# Patient Record
Sex: Male | Born: 1945 | Race: Black or African American | Hispanic: No | Marital: Married | Smoking: Never smoker
Health system: Southern US, Community
[De-identification: ages and names within clinical notes are randomized; demographics above are authoritative.]

## PROBLEM LIST (undated history)

## (undated) DIAGNOSIS — F4310 Post-traumatic stress disorder, unspecified: Secondary | ICD-10-CM

## (undated) DIAGNOSIS — H269 Unspecified cataract: Secondary | ICD-10-CM

## (undated) DIAGNOSIS — I10 Essential (primary) hypertension: Secondary | ICD-10-CM

## (undated) HISTORY — DX: Unspecified cataract: H26.9

## (undated) HISTORY — DX: Post-traumatic stress disorder, unspecified: F43.10

## (undated) HISTORY — DX: Essential (primary) hypertension: I10

---

## 2014-07-14 DIAGNOSIS — R7309 Other abnormal glucose: Secondary | ICD-10-CM | POA: Diagnosis not present

## 2014-09-23 DIAGNOSIS — Z79899 Other long term (current) drug therapy: Secondary | ICD-10-CM | POA: Diagnosis not present

## 2014-09-23 DIAGNOSIS — Z0000 Encounter for general adult medical examination without abnormal findings: Secondary | ICD-10-CM | POA: Diagnosis not present

## 2015-06-11 DIAGNOSIS — I10 Essential (primary) hypertension: Secondary | ICD-10-CM | POA: Diagnosis not present

## 2015-06-11 DIAGNOSIS — E559 Vitamin D deficiency, unspecified: Secondary | ICD-10-CM | POA: Diagnosis not present

## 2015-06-15 DIAGNOSIS — Z125 Encounter for screening for malignant neoplasm of prostate: Secondary | ICD-10-CM | POA: Diagnosis not present

## 2015-06-15 DIAGNOSIS — Z1212 Encounter for screening for malignant neoplasm of rectum: Secondary | ICD-10-CM | POA: Diagnosis not present

## 2015-06-15 DIAGNOSIS — Z0000 Encounter for general adult medical examination without abnormal findings: Secondary | ICD-10-CM | POA: Diagnosis not present

## 2015-06-15 DIAGNOSIS — I10 Essential (primary) hypertension: Secondary | ICD-10-CM | POA: Diagnosis not present

## 2015-06-15 DIAGNOSIS — R7309 Other abnormal glucose: Secondary | ICD-10-CM | POA: Diagnosis not present

## 2015-06-15 DIAGNOSIS — E559 Vitamin D deficiency, unspecified: Secondary | ICD-10-CM | POA: Diagnosis not present

## 2015-06-25 DIAGNOSIS — N281 Cyst of kidney, acquired: Secondary | ICD-10-CM | POA: Diagnosis not present

## 2015-12-22 DIAGNOSIS — Z79899 Other long term (current) drug therapy: Secondary | ICD-10-CM | POA: Diagnosis not present

## 2015-12-22 DIAGNOSIS — I10 Essential (primary) hypertension: Secondary | ICD-10-CM | POA: Diagnosis not present

## 2015-12-22 DIAGNOSIS — J309 Allergic rhinitis, unspecified: Secondary | ICD-10-CM | POA: Diagnosis not present

## 2016-07-19 DIAGNOSIS — E559 Vitamin D deficiency, unspecified: Secondary | ICD-10-CM | POA: Diagnosis not present

## 2016-07-19 DIAGNOSIS — R7309 Other abnormal glucose: Secondary | ICD-10-CM | POA: Diagnosis not present

## 2016-07-19 DIAGNOSIS — Z0000 Encounter for general adult medical examination without abnormal findings: Secondary | ICD-10-CM | POA: Diagnosis not present

## 2016-07-19 DIAGNOSIS — I10 Essential (primary) hypertension: Secondary | ICD-10-CM | POA: Diagnosis not present

## 2017-06-05 HISTORY — PX: CATARACT EXTRACTION, BILATERAL: SHX1313

## 2017-07-20 DIAGNOSIS — Z1211 Encounter for screening for malignant neoplasm of colon: Secondary | ICD-10-CM | POA: Diagnosis not present

## 2017-07-20 DIAGNOSIS — I10 Essential (primary) hypertension: Secondary | ICD-10-CM | POA: Diagnosis not present

## 2017-08-21 DIAGNOSIS — I10 Essential (primary) hypertension: Secondary | ICD-10-CM | POA: Diagnosis not present

## 2017-08-21 DIAGNOSIS — Z79899 Other long term (current) drug therapy: Secondary | ICD-10-CM | POA: Diagnosis not present

## 2017-08-21 DIAGNOSIS — R7309 Other abnormal glucose: Secondary | ICD-10-CM | POA: Diagnosis not present

## 2017-08-21 DIAGNOSIS — E559 Vitamin D deficiency, unspecified: Secondary | ICD-10-CM | POA: Diagnosis not present

## 2017-11-02 DIAGNOSIS — D369 Benign neoplasm, unspecified site: Secondary | ICD-10-CM | POA: Diagnosis not present

## 2017-11-02 DIAGNOSIS — K5140 Inflammatory polyps of colon without complications: Secondary | ICD-10-CM | POA: Diagnosis not present

## 2017-11-02 DIAGNOSIS — Z1211 Encounter for screening for malignant neoplasm of colon: Secondary | ICD-10-CM | POA: Diagnosis not present

## 2017-11-02 DIAGNOSIS — D123 Benign neoplasm of transverse colon: Secondary | ICD-10-CM | POA: Diagnosis not present

## 2017-11-02 DIAGNOSIS — K5730 Diverticulosis of large intestine without perforation or abscess without bleeding: Secondary | ICD-10-CM | POA: Diagnosis not present

## 2017-11-02 DIAGNOSIS — D122 Benign neoplasm of ascending colon: Secondary | ICD-10-CM | POA: Diagnosis not present

## 2017-11-02 DIAGNOSIS — Z86010 Personal history of colonic polyps: Secondary | ICD-10-CM | POA: Diagnosis not present

## 2017-11-07 DIAGNOSIS — I10 Essential (primary) hypertension: Secondary | ICD-10-CM | POA: Diagnosis not present

## 2017-11-07 DIAGNOSIS — Z86010 Personal history of colonic polyps: Secondary | ICD-10-CM | POA: Diagnosis not present

## 2018-05-09 ENCOUNTER — Ambulatory Visit: Payer: Self-pay | Admitting: Internal Medicine

## 2018-08-15 ENCOUNTER — Encounter: Payer: Self-pay | Admitting: Internal Medicine

## 2018-08-15 ENCOUNTER — Ambulatory Visit (INDEPENDENT_AMBULATORY_CARE_PROVIDER_SITE_OTHER): Payer: Medicare Other

## 2018-08-15 ENCOUNTER — Ambulatory Visit: Payer: Self-pay | Admitting: Internal Medicine

## 2018-08-15 ENCOUNTER — Ambulatory Visit (INDEPENDENT_AMBULATORY_CARE_PROVIDER_SITE_OTHER): Payer: Medicare Other | Admitting: Internal Medicine

## 2018-08-15 ENCOUNTER — Other Ambulatory Visit: Payer: Self-pay

## 2018-08-15 VITALS — BP 130/80 | HR 86 | Temp 98.7°F | Ht 68.8 in | Wt 245.8 lb

## 2018-08-15 DIAGNOSIS — I10 Essential (primary) hypertension: Secondary | ICD-10-CM | POA: Diagnosis not present

## 2018-08-15 DIAGNOSIS — F4312 Post-traumatic stress disorder, chronic: Secondary | ICD-10-CM

## 2018-08-15 DIAGNOSIS — Z0000 Encounter for general adult medical examination without abnormal findings: Secondary | ICD-10-CM

## 2018-08-15 DIAGNOSIS — Z6836 Body mass index (BMI) 36.0-36.9, adult: Secondary | ICD-10-CM | POA: Diagnosis not present

## 2018-08-15 LAB — POCT URINALYSIS DIPSTICK
Bilirubin, UA: NEGATIVE
Glucose, UA: NEGATIVE
Ketones, UA: NEGATIVE
Nitrite, UA: NEGATIVE
PROTEIN UA: NEGATIVE
Spec Grav, UA: 1.025 (ref 1.010–1.025)
Urobilinogen, UA: 0.2 E.U./dL
pH, UA: 5 (ref 5.0–8.0)

## 2018-08-15 NOTE — Patient Instructions (Signed)
Christopher Harvey , Thank you for taking time to come for your Medicare Wellness Visit. I appreciate your ongoing commitment to your health goals. Please review the following plan we discussed and let me know if I can assist you in the future.   Screening recommendations/referrals: Colonoscopy: 10/2017 Recommended yearly ophthalmology/optometry visit for glaucoma screening and checkup Recommended yearly dental visit for hygiene and checkup  Vaccinations: Influenza vaccine: 02/2018 [facility] Tdap vaccine: 05/2013 Shingles vaccine: states had both    Advanced directives: .Advance directive discussed with you today. Even though you declined this today please call our office should you change your mind and we can give you the proper paperwork for you to fill out.   Conditions/risks identified: obese  Next appointment: 02/20/2019 at 245  Preventive Care 72 Years and Older, Male Preventive care refers to lifestyle choices and visits with your health care provider that can promote health and wellness. What does preventive care include?  A yearly physical exam. This is also called an annual well check.  Dental exams once or twice a year.  Routine eye exams. Ask your health care provider how often you should have your eyes checked.  Personal lifestyle choices, including:  Daily care of your teeth and gums.  Regular physical activity.  Eating a healthy diet.  Avoiding tobacco and drug use.  Limiting alcohol use.  Practicing safe sex.  Taking low doses of aspirin every day.  Taking vitamin and mineral supplements as recommended by your health care provider. What happens during an annual well check? The services and screenings done by your health care provider during your annual well check will depend on your age, overall health, lifestyle risk factors, and family history of disease. Counseling  Your health care provider may ask you questions about your:  Alcohol  use.  Tobacco use.  Drug use.  Emotional well-being.  Home and relationship well-being.  Sexual activity.  Eating habits.  History of falls.  Memory and ability to understand (cognition).  Work and work Statistician. Screening  You may have the following tests or measurements:  Height, weight, and BMI.  Blood pressure.  Lipid and cholesterol levels. These may be checked every 5 years, or more frequently if you are over 23 years old.  Skin check.  Lung cancer screening. You may have this screening every year starting at age 42 if you have a 30-pack-year history of smoking and currently smoke or have quit within the past 15 years.  Fecal occult blood test (FOBT) of the stool. You may have this test every year starting at age 25.  Flexible sigmoidoscopy or colonoscopy. You may have a sigmoidoscopy every 5 years or a colonoscopy every 10 years starting at age 33.  Prostate cancer screening. Recommendations will vary depending on your family history and other risks.  Hepatitis C blood test.  Hepatitis B blood test.  Sexually transmitted disease (STD) testing.  Diabetes screening. This is done by checking your blood sugar (glucose) after you have not eaten for a while (fasting). You may have this done every 1-3 years.  Abdominal aortic aneurysm (AAA) screening. You may need this if you are a current or former smoker.  Osteoporosis. You may be screened starting at age 63 if you are at high risk. Talk with your health care provider about your test results, treatment options, and if necessary, the need for more tests. Vaccines  Your health care provider may recommend certain vaccines, such as:  Influenza vaccine. This  is recommended every year.  Tetanus, diphtheria, and acellular pertussis (Tdap, Td) vaccine. You may need a Td booster every 10 years.  Zoster vaccine. You may need this after age 43.  Pneumococcal 13-valent conjugate (PCV13) vaccine. One dose is  recommended after age 69.  Pneumococcal polysaccharide (PPSV23) vaccine. One dose is recommended after age 58. Talk to your health care provider about which screenings and vaccines you need and how often you need them. This information is not intended to replace advice given to you by your health care provider. Make sure you discuss any questions you have with your health care provider. Document Released: 06/18/2015 Document Revised: 02/09/2016 Document Reviewed: 03/23/2015 Elsevier Interactive Patient Education  2017 [place] Prevention in the Home Falls can cause injuries. They can happen to people of all ages. There are many things you can do to make your home safe and to help prevent falls. What can I do on the outside of my home?  Regularly fix the edges of walkways and driveways and fix any cracks.  Remove anything that might make you trip as you walk through a door, such as a raised step or threshold.  Trim any bushes or trees on the path to your home.  Use bright outdoor lighting.  Clear any walking paths of anything that might make someone trip, such as rocks or tools.  Regularly check to see if handrails are loose or broken. Make sure that both sides of any steps have handrails.  Any raised decks and porches should have guardrails on the edges.  Have any leaves, snow, or ice cleared regularly.  Use sand or salt on walking paths during winter.  Clean up any spills in your garage right away. This includes oil or grease spills. What can I do in the bathroom?  Use night lights.  Install grab bars by the toilet and in the tub and shower. Do not use towel bars as grab bars.  Use non-skid mats or decals in the tub or shower.  If you need to sit down in the shower, use a plastic, non-slip stool.  Keep the floor dry. Clean up any water that spills on the floor as soon as it happens.  Remove soap buildup in the tub or shower regularly.  Attach bath mats  securely with double-sided non-slip rug tape.  Do not have throw rugs and other things on the floor that can make you trip. What can I do in the bedroom?  Use night lights.  Make sure that you have a light by your bed that is easy to reach.  Do not use any sheets or blankets that are too big for your bed. They should not hang down onto the floor.  Have a firm chair that has side arms. You can use this for support while you get dressed.  Do not have throw rugs and other things on the floor that can make you trip. What can I do in the kitchen?  Clean up any spills right away.  Avoid walking on wet floors.  Keep items that you use a lot in easy-to-reach places.  If you need to reach something above you, use a strong step stool that has a grab bar.  Keep electrical cords out of the way.  Do not use floor polish or wax that makes floors slippery. If you must use wax, use non-skid floor wax.  Do not have throw rugs and other things on the floor that can make you  trip. What can I do with my stairs?  Do not leave any items on the stairs.  Make sure that there are handrails on both sides of the stairs and use them. Fix handrails that are broken or loose. Make sure that handrails are as long as the stairways.  Check any carpeting to make sure that it is firmly attached to the stairs. Fix any carpet that is loose or worn.  Avoid having throw rugs at the top or bottom of the stairs. If you do have throw rugs, attach them to the floor with carpet tape.  Make sure that you have a light switch at the top of the stairs and the bottom of the stairs. If you do not have them, ask someone to add them for you. What else can I do to help prevent falls?  Wear shoes that:  Do not have high heels.  Have rubber bottoms.  Are comfortable and fit you well.  Are closed at the toe. Do not wear sandals.  If you use a stepladder:  Make sure that it is fully opened. Do not climb a closed  stepladder.  Make sure that both sides of the stepladder are locked into place.  Ask someone to hold it for you, if possible.  Clearly mark and make sure that you can see:  Any grab bars or handrails.  First and last steps.  Where the edge of each step is.  Use tools that help you move around (mobility aids) if they are needed. These include:  Canes.  Walkers.  Scooters.  Crutches.  Turn on the lights when you go into a dark area. Replace any light bulbs as soon as they burn out.  Set up your furniture so you have a clear path. Avoid moving your furniture around.  If any of your floors are uneven, fix them.  If there are any pets around you, be aware of where they are.  Review your medicines with your doctor. Some medicines can make you feel dizzy. This can increase your chance of falling. Ask your doctor what other things that you can do to help prevent falls. This information is not intended to replace advice given to you by your health care provider. Make sure you discuss any questions you have with your health care provider. Document Released: 03/18/2009 Document Revised: 10/28/2015 Document Reviewed: 06/26/2014 Elsevier Interactive Patient Education  2017 [employer].

## 2018-08-15 NOTE — Progress Notes (Signed)
Subjective:   Christopher Harvey is a 73 y.o. male who presents for Medicare Annual/Subsequent preventive examination.  Review of Systems:  n/a Cardiac Risk Factors include: advanced age (>52men, >58 women);hypertension;sedentary lifestyle;obesity (BMI >30kg/m2)     Objective:    Vitals: BP 130/80 (BP Location: Left Arm, Patient Position: Sitting)   Pulse 86   Temp 98.7 F (37.1 C) (Oral)   Ht 5' 8.8" (1.748 m)   Wt 245 lb 12.8 oz (111.5 kg)   SpO2 96%   BMI 36.51 kg/m   Body mass index is 36.51 kg/m.  Advanced Directives 08/15/2018  Does Patient Have a Medical Advance Directive? No  Would patient like information on creating a medical advance directive? 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 Hypertension   . PTSD (post-traumatic stress disorder)    Past Surgical History:  Procedure Laterality Date  . CATARACT EXTRACTION, BILATERAL Bilateral 2019   at [place] History  Problem Relation Age of Onset  . Hypertension Mother   . Hypertension Father    Social History   Socioeconomic History  . Marital status: Married    Spouse name: Not on file  . Number of children: Not on file  . Years of education: Not on file  . Highest education level: Not on file  Occupational History  . Occupation:  retired  Scientific laboratory technician  . Financial resource strain: Not hard at all  . Food insecurity:    Worry: Never true    Inability: Never true  . Transportation needs:    Medical: No    Non-medical: No  Tobacco Use  . Smoking status: Never Smoker  . Smokeless tobacco: Never Used  Substance and Sexual Activity  . Alcohol use: Not Currently  . Drug use: Not Currently  . Sexual activity: Yes  Lifestyle  . Physical activity:    Days per week: 0 days    Minutes per session: 0 min  . Stress: Not at all  Relationships  . Social connections:    Talks on phone: Not on file    Gets together: Not on file    Attends religious service: Not on file    Active member of club or organization: Not on file    Attends meetings of clubs or organizations: Not on file    Relationship status: Not on file  Other Topics Concern  . Not on file  Social History Narrative  . Not on file    Outpatient Encounter Medications as of 08/15/2018  Medication Sig  . buPROPion (WELLBUTRIN XL) 150 MG 24 hr tablet Take by mouth.  . hydrochlorothiazide (HYDRODIURIL) 25 MG tablet Take by mouth.  . loratadine (CLARITIN) 10 MG tablet Take by mouth.  . risperiDONE (RISPERDAL) 1 MG tablet Take by  mouth.   No facility-administered encounter medications on file as of 08/15/2018.     Activities of Daily Living In your present state of health, do you have any difficulty performing the following activities: 08/15/2018  Hearing? N  Vision? N  Difficulty concentrating or making decisions? N  Walking or climbing stairs? N  Dressing or bathing? N  Doing errands, shopping? N  Preparing Food and eating ? N  Using the Toilet? N  In the past six months, have you accidently leaked urine? N  Do you have problems with loss of bowel control? N  Managing your Medications? N  Managing your Finances? N  Housekeeping or managing your Housekeeping? N  Some recent data might be hidden    Patient Care Team: Glendale Chard, MD as PCP -  General (Internal Medicine)   Assessment:   This is a routine wellness examination for Christopher Harvey.  Exercise Activities and Dietary recommendations Current Exercise Habits: The patient does not participate in regular exercise at present  Goals    . Patient Stated (pt-stated)     To make it to the end of the year       Fall Risk Fall Risk  08/15/2018  Falls in the past year? 0  Risk for fall due to : Medication side effect  Follow up Education provided;Falls prevention discussed   Is the patient's home free of loose throw rugs in walkways, pet beds, electrical cords, etc?   yes      Grab bars in the bathroom? no      Handrails on the stairs?   yes      Adequate lighting?   yes  Timed Get Up and Go Performed: n/a  Depression Screen PHQ 2/9 Scores 08/15/2018  PHQ - 2 Score 0  PHQ- 9 Score 0    Cognitive Function     6CIT Screen 08/15/2018  What Year? 0 points  What month? 0 points  What time? 0 points  Count back from 20 0 points  Months in reverse 0 points  Repeat phrase 0 points  Total Score 0     There is no immunization history on file for this patient.  Qualifies for Shingles Vaccine? yes  Screening Tests Health Maintenance  Topic Date Due  . PNA vac Low Risk Adult (1 of 2 - PCV13) 12/01/2011  . TETANUS/TDAP  05/07/2023  . COLONOSCOPY  11/03/2027  . INFLUENZA VACCINE  Completed  . Hepatitis C Screening  Completed   Cancer Screenings: Lung: Low Dose CT Chest recommended if Age 59-80 years, 30 pack-year currently smoking OR have quit w/in 15years. Patient does not qualify. Colorectal: up to date  Additional Screenings:  Hepatitis C Screening:      Plan:    States had pneumonia vaccines at the [name]  I have personally reviewed and noted the following in the patient's chart:   . Medical and social history . Use of alcohol, tobacco or illicit drugs  . Current medications and supplements . Functional ability and status . Nutritional status . Physical  activity . Advanced directives . List of other physicians . Hospitalizations, surgeries, and ER visits in previous 12 months . Vitals . Screenings to include cognitive, depression, and falls . Referrals and appointments  In addition, I have reviewed and discussed with patient certain preventive protocols, quality metrics, and best practice recommendations. A written personalized care plan for preventive services as well as general preventive health recommendations were provided to patient.     Marissa Calamity  Zenia Resides, LPN  [id number]

## 2018-08-15 NOTE — Patient Instructions (Addendum)

## 2018-08-16 LAB — CMP14+EGFR
ALT: 23 IU/L (ref 0–44)
AST: 20 IU/L (ref 0–40)
Albumin/Globulin Ratio: 1.3 (ref 1.2–2.2)
Albumin: 4 g/dL (ref 3.7–4.7)
Alkaline Phosphatase: 76 IU/L (ref 39–117)
BUN/Creatinine Ratio: 12 (ref 10–24)
BUN: 14 mg/dL (ref 8–27)
Bilirubin Total: 0.4 mg/dL (ref 0.0–1.2)
CO2: 24 mmol/L (ref 20–29)
Calcium: 9.6 mg/dL (ref 8.6–10.2)
Chloride: 104 mmol/L (ref 96–106)
Creatinine, Ser: 1.13 mg/dL (ref 0.76–1.27)
GFR calc Af Amer: 75 mL/min/{1.73_m2} (ref >59)
GFR calc non Af Amer: 65 mL/min/{1.73_m2} (ref >59)
Globulin, Total: 3.1 g/dL (ref 1.5–4.5)
Glucose: 86 mg/dL (ref 65–99)
Potassium: 4.7 mmol/L (ref 3.5–5.2)
Sodium: 142 mmol/L (ref 134–144)
Total Protein: 7.1 g/dL (ref 6.0–8.5)

## 2018-08-16 LAB — LIPID PANEL
Chol/HDL Ratio: 4.1 ratio (ref 0.0–5.0)
Cholesterol, Total: 158 mg/dL (ref 100–199)
HDL: 39 mg/dL — ABNORMAL LOW (ref >39)
LDL Calculated: 105 mg/dL — ABNORMAL HIGH (ref 0–99)
Triglycerides: 71 mg/dL (ref 0–149)
VLDL Cholesterol Cal: 14 mg/dL (ref 5–40)

## 2018-09-01 DIAGNOSIS — E66812 Obesity, class 2: Secondary | ICD-10-CM | POA: Insufficient documentation

## 2018-09-01 DIAGNOSIS — Z6836 Body mass index (BMI) 36.0-36.9, adult: Secondary | ICD-10-CM

## 2018-09-01 DIAGNOSIS — F4312 Post-traumatic stress disorder, chronic: Secondary | ICD-10-CM | POA: Insufficient documentation

## 2018-09-01 DIAGNOSIS — I10 Essential (primary) hypertension: Secondary | ICD-10-CM | POA: Insufficient documentation

## 2018-09-01 NOTE — Progress Notes (Signed)
Subjective:     Patient ID: Christopher Harvey , male    DOB: 1946/02/06 , 73 y.o.   MRN: [id number]   Chief Complaint  Patient presents with  . Hypertension    HPI  Hypertension  This is a chronic problem. The current episode started more than 1 year ago. The problem has been gradually improving since onset. The problem is controlled. Pertinent negatives include no blurred vision, chest pain, palpitations or shortness of breath.   He reports compliance with meds.   Past Medical History:  Diagnosis Date  . Hypertension   . PTSD (post-traumatic stress disorder)      Family History  Problem Relation Age of Onset  . Hypertension Mother   . Hypertension Father      Current Outpatient Medications:  .  buPROPion (WELLBUTRIN XL) 150 MG 24 hr tablet, Take by mouth., Disp: , Rfl:  .  hydrochlorothiazide (HYDRODIURIL) 25 MG tablet, Take by mouth., Disp: , Rfl:  .  loratadine (CLARITIN) 10 MG tablet, Take by mouth., Disp: , Rfl:  .  risperiDONE (RISPERDAL) 1 MG tablet, Take by mouth., Disp: , Rfl:    No Known Allergies   Review of Systems  Constitutional: Negative.   Eyes: Negative for blurred vision.  Respiratory: Negative.  Negative for shortness of breath.   Cardiovascular: Negative.  Negative for chest pain and palpitations.  Gastrointestinal: Negative.   Neurological: Negative.   Psychiatric/Behavioral: Negative.      Today's Vitals   08/15/18 1450  BP: 130/80  Pulse: 86  Temp: 98.7 F (37.1 C)  TempSrc: Oral  Weight: 245 lb 12.8 oz (111.5 kg)  Height: 5' 8.8" (1.748 m)   Body mass index is 36.51 kg/m.   Objective:  Physical Exam Vitals signs and nursing note reviewed.  Constitutional:      Appearance: Normal appearance.  Cardiovascular:     Rate and Rhythm: Normal rate and regular rhythm.     Heart sounds: Normal heart sounds.  Pulmonary:     Effort: Pulmonary effort is normal.     Breath sounds: Normal breath sounds.  Skin:    General: Skin is warm.   Neurological:     General: No focal deficit present.     Mental Status: He is alert.  Psychiatric:        Mood and Affect: Mood normal.         Assessment And Plan:     1. Essential hypertension  Controlled. He will continue with current meds. He is encouraged to avoid adding salt to his foods. Importance of regular exercise was discussed with the patient. He will rto in six months for his next AWV.   - CMP14+EGFR - Lipid panel  2. Chronic post-traumatic stress disorder (PTSD)  Chronic, yet stable. He is also followed by the [name]. He will continue with current meds.   3. Class 2 severe obesity due to excess calories with serious comorbidity and body mass index (BMI) of 36.0 to 36.9 in adult [facility])  Importance of achieving optimal weight to decrease risk of cardiovascular disease and cancers was discussed with the patient in full detail. He is encouraged to start slowly - start with 10 minutes twice daily at least three to four days per week and to gradually build to 30 minutes five days weekly. He was given tips to incorporate more activity into his daily routine - take stairs when possible, park farther away from her grocery stores, etc.      Theda Belfast  [facility], MD

## 2018-10-11 ENCOUNTER — Encounter: Payer: Self-pay | Admitting: Internal Medicine

## 2019-02-20 ENCOUNTER — Other Ambulatory Visit: Payer: Self-pay

## 2019-02-20 ENCOUNTER — Ambulatory Visit (INDEPENDENT_AMBULATORY_CARE_PROVIDER_SITE_OTHER): Payer: Medicare Other | Admitting: Internal Medicine

## 2019-02-20 ENCOUNTER — Encounter: Payer: Self-pay | Admitting: Internal Medicine

## 2019-02-20 VITALS — BP 140/88 | HR 75 | Temp 98.5°F | Ht 69.0 in | Wt 239.6 lb

## 2019-02-20 DIAGNOSIS — N182 Chronic kidney disease, stage 2 (mild): Secondary | ICD-10-CM | POA: Diagnosis not present

## 2019-02-20 DIAGNOSIS — E66812 Morbid (severe) obesity due to excess calories: Secondary | ICD-10-CM

## 2019-02-20 DIAGNOSIS — I129 Hypertensive chronic kidney disease with stage 1 through stage 4 chronic kidney disease, or unspecified chronic kidney disease: Secondary | ICD-10-CM

## 2019-02-20 DIAGNOSIS — Z6835 Body mass index (BMI) 35.0-35.9, adult: Secondary | ICD-10-CM

## 2019-02-21 LAB — CMP14+EGFR
ALT: 22 IU/L (ref 0–44)
AST: 20 IU/L (ref 0–40)
Albumin/Globulin Ratio: 1.6 (ref 1.2–2.2)
Albumin: 4.3 g/dL (ref 3.7–4.7)
Alkaline Phosphatase: 68 IU/L (ref 39–117)
BUN/Creatinine Ratio: 13 (ref 10–24)
BUN: 15 mg/dL (ref 8–27)
Bilirubin Total: 0.4 mg/dL (ref 0.0–1.2)
CO2: 26 mmol/L (ref 20–29)
Calcium: 9.6 mg/dL (ref 8.6–10.2)
Chloride: 103 mmol/L (ref 96–106)
Creatinine, Ser: 1.13 mg/dL (ref 0.76–1.27)
GFR calc Af Amer: 75 mL/min/{1.73_m2} (ref >59)
GFR calc non Af Amer: 65 mL/min/{1.73_m2} (ref >59)
Globulin, Total: 2.7 g/dL (ref 1.5–4.5)
Glucose: 78 mg/dL (ref 65–99)
Potassium: 5 mmol/L (ref 3.5–5.2)
Sodium: 143 mmol/L (ref 134–144)
Total Protein: 7 g/dL (ref 6.0–8.5)

## 2019-02-23 NOTE — Progress Notes (Signed)
Subjective:     Patient ID: Christopher Harvey , male    DOB: 1946-02-16 , 73 y.o.   MRN: [id number]   Chief Complaint  Patient presents with  . Hypertension    HPI  Hypertension This is a chronic problem. The current episode started more than 1 year ago. The problem has been gradually improving since onset. The problem is uncontrolled. Pertinent negatives include no blurred vision, chest pain, palpitations or shortness of breath. Past treatments include diuretics. The current treatment provides moderate improvement. Compliance problems include exercise.  Hypertensive end-organ damage includes kidney disease.     Past Medical History:  Diagnosis Date  . Hypertension   . PTSD (post-traumatic stress disorder)      Family History  Problem Relation Age of Onset  . Hypertension Mother   . Hypertension Father      Current Outpatient Medications:  .  buPROPion (WELLBUTRIN XL) 150 MG 24 hr tablet, Take by mouth., Disp: , Rfl:  .  hydrochlorothiazide (HYDRODIURIL) 25 MG tablet, Take by mouth., Disp: , Rfl:  .  loratadine (CLARITIN) 10 MG tablet, Take by mouth., Disp: , Rfl:  .  risperiDONE (RISPERDAL) 1 MG tablet, Take by mouth., Disp: , Rfl:    No Known Allergies   Review of Systems  Constitutional: Negative.   Eyes: Negative for blurred vision.  Respiratory: Negative.  Negative for shortness of breath.   Cardiovascular: Negative.  Negative for chest pain and palpitations.  Gastrointestinal: Negative.   Neurological: Negative.   Psychiatric/Behavioral: Negative.      Today's Vitals   02/20/19 1444  BP: 140/88  Pulse: 75  Temp: 98.5 F (36.9 C)  TempSrc: Oral  SpO2: 95%  Weight: 239 lb 9.6 oz (108.7 kg)  Height: '5\' 9"'$  (1.753 m)   Body mass index is 35.38 kg/m.   Objective:  Physical Exam Vitals signs and nursing note reviewed.  Constitutional:      Appearance: Normal appearance. He is obese.  Cardiovascular:     Rate and Rhythm: Normal rate and regular rhythm.      Heart sounds: Normal heart sounds.  Pulmonary:     Effort: Pulmonary effort is normal.     Breath sounds: Normal breath sounds.  Skin:    General: Skin is warm.  Neurological:     General: No focal deficit present.     Mental Status: He is alert.  Psychiatric:        Mood and Affect: Mood normal.         Assessment And Plan:     1. Parenchymal renal hypertension, stage 1 through stage 4 or unspecified chronic kidney disease  Chronic, fair control. He is aware that optimal bp is less than 130/80. He is encouraged to increase daily activity and to avoid adding salt to his foods. He is reminded breads and cheeses have high sodium levels, even though they may not taste salty.   - CMP14+EGFR  2. Chronic renal disease, stage II  Chronic, I will check a GFR,. Cr today. He is encouraged to stay well hydrated.   3. Class 2 severe obesity due to excess calories with serious comorbidity and body mass index (BMI) of 35.0 to 35.9 in adult Christopher Harvey)  He was congratulated on his six pound weight loss since March 2020. He is encouraged to increase his daily activity and to incorporate more exercise into her daily routine.   Christopher Greenland, MD    THE PATIENT IS ENCOURAGED TO PRACTICE SOCIAL DISTANCING DUE  TO THE COVID-19 PANDEMIC.

## 2019-02-27 ENCOUNTER — Encounter: Payer: Self-pay | Admitting: Internal Medicine

## 2019-08-25 ENCOUNTER — Telehealth: Payer: Self-pay

## 2019-08-25 NOTE — Telephone Encounter (Signed)
I returned the pt's call and told him that the call he got this morning was an automated call to remind him of his appt for this Wednesday at 2pm.

## 2019-08-27 ENCOUNTER — Other Ambulatory Visit: Payer: Self-pay

## 2019-08-27 ENCOUNTER — Ambulatory Visit (INDEPENDENT_AMBULATORY_CARE_PROVIDER_SITE_OTHER): Payer: Medicare Other | Admitting: Internal Medicine

## 2019-08-27 ENCOUNTER — Encounter: Payer: Self-pay | Admitting: Internal Medicine

## 2019-08-27 ENCOUNTER — Ambulatory Visit (INDEPENDENT_AMBULATORY_CARE_PROVIDER_SITE_OTHER): Payer: Medicare Other

## 2019-08-27 VITALS — BP 124/80 | HR 82 | Temp 98.7°F | Ht 68.8 in | Wt 239.0 lb

## 2019-08-27 DIAGNOSIS — N3943 Post-void dribbling: Secondary | ICD-10-CM | POA: Diagnosis not present

## 2019-08-27 DIAGNOSIS — M79671 Pain in right foot: Secondary | ICD-10-CM | POA: Diagnosis not present

## 2019-08-27 DIAGNOSIS — I129 Hypertensive chronic kidney disease with stage 1 through stage 4 chronic kidney disease, or unspecified chronic kidney disease: Secondary | ICD-10-CM | POA: Diagnosis not present

## 2019-08-27 DIAGNOSIS — N401 Enlarged prostate with lower urinary tract symptoms: Secondary | ICD-10-CM

## 2019-08-27 DIAGNOSIS — N182 Chronic kidney disease, stage 2 (mild): Secondary | ICD-10-CM | POA: Diagnosis not present

## 2019-08-27 DIAGNOSIS — M79672 Pain in left foot: Secondary | ICD-10-CM | POA: Diagnosis not present

## 2019-08-27 DIAGNOSIS — Z0000 Encounter for general adult medical examination without abnormal findings: Secondary | ICD-10-CM | POA: Diagnosis not present

## 2019-08-27 DIAGNOSIS — Z6835 Body mass index (BMI) 35.0-35.9, adult: Secondary | ICD-10-CM

## 2019-08-27 DIAGNOSIS — I10 Essential (primary) hypertension: Secondary | ICD-10-CM | POA: Diagnosis not present

## 2019-08-27 LAB — POCT URINALYSIS DIPSTICK
Bilirubin, UA: NEGATIVE
Glucose, UA: NEGATIVE
Ketones, UA: NEGATIVE
Leukocytes, UA: NEGATIVE
Nitrite, UA: NEGATIVE
Protein, UA: NEGATIVE
Spec Grav, UA: 1.025 (ref 1.010–1.025)
Urobilinogen, UA: 0.2 E.U./dL
pH, UA: 7.5 (ref 5.0–8.0)

## 2019-08-27 LAB — POCT UA - MICROALBUMIN
Albumin/Creatinine Ratio, Urine, POC: <30
Creatinine, POC: 300 mg/dL
Microalbumin Ur, POC: 30 mg/L

## 2019-08-27 NOTE — Patient Instructions (Signed)
[name] , Thank you for taking time to come for your Medicare Wellness Visit. I appreciate your ongoing commitment to your health goals. Please review the following plan we discussed and let me know if I can assist you in the future.   Screening recommendations/referrals: Colonoscopy: 10/2017 Recommended yearly ophthalmology/optometry visit for glaucoma screening and checkup Recommended yearly dental visit for hygiene and checkup  Vaccinations: Influenza vaccine: 02/2019 Pneumococcal vaccine: 10/2017 Tdap vaccine: 05/2013 Shingles vaccine: discussed    Advanced directives: Please bring a copy of your POA (Power of [place]) and/or Living Will to your next appointment.    Conditions/risks identified: obesity  Next appointment:   Preventive Care 73 Years and Older, Male Preventive care refers to lifestyle choices and visits with your health care provider that can promote health and wellness. What does preventive care include?  A yearly physical exam. This is also called an annual well check.  Dental exams once or twice a year.  Routine eye exams. Ask your health care provider how often you should have your eyes checked.  Personal lifestyle choices, including:  Daily care of your teeth and gums.  Regular physical activity.  Eating a healthy diet.  Avoiding tobacco and drug use.  Limiting alcohol use.  Practicing safe sex.  Taking low doses of aspirin every day.  Taking vitamin and mineral supplements as recommended by your health care provider. What happens during an annual well check? The services and screenings done by your health care provider during your annual well check will depend on your age, overall health, lifestyle risk factors, and family history of disease. Counseling  Your health care provider may ask you questions about your:  Alcohol use.  Tobacco use.  Drug use.  Emotional well-being.  Home and relationship well-being.  Sexual  activity.  Eating habits.  History of falls.  Memory and ability to understand (cognition).  Work and work Statistician. Screening  You may have the following tests or measurements:  Height, weight, and BMI.  Blood pressure.  Lipid and cholesterol levels. These may be checked every 5 years, or more frequently if you are over 68 years old.  Skin check.  Lung cancer screening. You may have this screening every year starting at age 13 if you have a 30-pack-year history of smoking and currently smoke or have quit within the past 15 years.  Fecal occult blood test (FOBT) of the stool. You may have this test every year starting at age 32.  Flexible sigmoidoscopy or colonoscopy. You may have a sigmoidoscopy every 5 years or a colonoscopy every 10 years starting at age 19.  Prostate cancer screening. Recommendations will vary depending on your family history and other risks.  Hepatitis C blood test.  Hepatitis B blood test.  Sexually transmitted disease (STD) testing.  Diabetes screening. This is done by checking your blood sugar (glucose) after you have not eaten for a while (fasting). You may have this done every 1-3 years.  Abdominal aortic aneurysm (AAA) screening. You may need this if you are a current or former smoker.  Osteoporosis. You may be screened starting at age 72 if you are at high risk. Talk with your health care provider about your test results, treatment options, and if necessary, the need for more tests. Vaccines  Your health care provider may recommend certain vaccines, such as:  Influenza vaccine. This is recommended every year.  Tetanus, diphtheria, and acellular pertussis (Tdap, Td) vaccine. You may need a Td booster every 10 years.  Zoster vaccine. You may need this after age 56.  Pneumococcal 13-valent conjugate (PCV13) vaccine. One dose is recommended after age 76.  Pneumococcal polysaccharide (PPSV23) vaccine. One dose is recommended after age  43. Talk to your health care provider about which screenings and vaccines you need and how often you need them. This information is not intended to replace advice given to you by your health care provider. Make sure you discuss any questions you have with your health care provider. Document Released: 06/18/2015 Document Revised: 02/09/2016 Document Reviewed: 03/23/2015 Elsevier Interactive Patient Education  2017 [employer] Prevention in the Home Falls can cause injuries. They can happen to people of all ages. There are many things you can do to make your home safe and to help prevent falls. What can I do on the outside of my home?  Regularly fix the edges of walkways and driveways and fix any cracks.  Remove anything that might make you trip as you walk through a door, such as a raised step or threshold.  Trim any bushes or trees on the path to your home.  Use bright outdoor lighting.  Clear any walking paths of anything that might make someone trip, such as rocks or tools.  Regularly check to see if handrails are loose or broken. Make sure that both sides of any steps have handrails.  Any raised decks and porches should have guardrails on the edges.  Have any leaves, snow, or ice cleared regularly.  Use sand or salt on walking paths during winter.  Clean up any spills in your garage right away. This includes oil or grease spills. What can I do in the bathroom?  Use night lights.  Install grab bars by the toilet and in the tub and shower. Do not use towel bars as grab bars.  Use non-skid mats or decals in the tub or shower.  If you need to sit down in the shower, use a plastic, non-slip stool.  Keep the floor dry. Clean up any water that spills on the floor as soon as it happens.  Remove soap buildup in the tub or shower regularly.  Attach bath mats securely with double-sided non-slip rug tape.  Do not have throw rugs and other things on the floor that can make  you trip. What can I do in the bedroom?  Use night lights.  Make sure that you have a light by your bed that is easy to reach.  Do not use any sheets or blankets that are too big for your bed. They should not hang down onto the floor.  Have a firm chair that has side arms. You can use this for support while you get dressed.  Do not have throw rugs and other things on the floor that can make you trip. What can I do in the kitchen?  Clean up any spills right away.  Avoid walking on wet floors.  Keep items that you use a lot in easy-to-reach places.  If you need to reach something above you, use a strong step stool that has a grab bar.  Keep electrical cords out of the way.  Do not use floor polish or wax that makes floors slippery. If you must use wax, use non-skid floor wax.  Do not have throw rugs and other things on the floor that can make you trip. What can I do with my stairs?  Do not leave any items on the stairs.  Make sure that there are  handrails on both sides of the stairs and use them. Fix handrails that are broken or loose. Make sure that handrails are as long as the stairways.  Check any carpeting to make sure that it is firmly attached to the stairs. Fix any carpet that is loose or worn.  Avoid having throw rugs at the top or bottom of the stairs. If you do have throw rugs, attach them to the floor with carpet tape.  Make sure that you have a light switch at the top of the stairs and the bottom of the stairs. If you do not have them, ask someone to add them for you. What else can I do to help prevent falls?  Wear shoes that:  Do not have high heels.  Have rubber bottoms.  Are comfortable and fit you well.  Are closed at the toe. Do not wear sandals.  If you use a stepladder:  Make sure that it is fully opened. Do not climb a closed stepladder.  Make sure that both sides of the stepladder are locked into place.  Ask someone to hold it for you, if  possible.  Clearly mark and make sure that you can see:  Any grab bars or handrails.  First and last steps.  Where the edge of each step is.  Use tools that help you move around (mobility aids) if they are needed. These include:  Canes.  Walkers.  Scooters.  Crutches.  Turn on the lights when you go into a dark area. Replace any light bulbs as soon as they burn out.  Set up your furniture so you have a clear path. Avoid moving your furniture around.  If any of your floors are uneven, fix them.  If there are any pets around you, be aware of where they are.  Review your medicines with your doctor. Some medicines can make you feel dizzy. This can increase your chance of falling. Ask your doctor what other things that you can do to help prevent falls. This information is not intended to replace advice given to you by your health care provider. Make sure you discuss any questions you have with your health care provider. Document Released: 03/18/2009 Document Revised: 10/28/2015 Document Reviewed: 06/26/2014 Elsevier Interactive Patient Education  2017 [employer].

## 2019-08-27 NOTE — Progress Notes (Signed)
This visit occurred during the SARS-CoV-2 public health emergency.  Safety protocols were in place, including screening questions prior to the visit, additional usage of staff PPE, and extensive cleaning of exam room while observing appropriate contact time as indicated for disinfecting solutions.  Subjective:   Christopher Harvey is a 74 y.o. male who presents for Medicare Annual/Subsequent preventive examination.  Review of Systems:  n/a Cardiac Risk Factors include: advanced age (>69men, >83 women);hypertension;male gender;obesity (BMI >30kg/m2)     Objective:    Vitals: BP 124/80 (BP Location: Left Arm, Patient Position: Sitting, Cuff Size: Large)   Pulse 82   Temp 98.7 F (37.1 C) (Oral)   Ht 5' 8.8" (1.748 m)   Wt 239 lb (108.4 kg)   SpO2 97%   BMI 35.50 kg/m   Body mass index is 35.5 kg/m.  Advanced Directives 08/27/2019 08/15/2018  Does Patient Have a Medical Advance Directive? Yes No  Type of Paramedic of [place];Living will -  Copy of [place] in Chart? No - copy requested -  Would patient like information on creating a medical advance directive? - No - Patient declined    Tobacco Social History   Tobacco Use  Smoking Status Never Smoker  Smokeless Tobacco Never Used     Counseling given: Not Answered   Clinical Intake:  Pre-visit preparation completed: Yes  Pain : No/denies pain     Nutritional Status: BMI > 30  Obese Nutritional Risks: None Diabetes: No  How often do you need to have someone help you when you read instructions, pamphlets, or other written materials from your doctor or pharmacy?: 1 - Never What is the last grade level you completed in school?: 2 years college  Interpreter Needed?: No  Information entered by :: NAllen LPN  Past Medical History:  Diagnosis Date  . Hypertension   . PTSD (post-traumatic stress disorder)    Past Surgical History:  Procedure Laterality Date  . CATARACT  EXTRACTION, BILATERAL Bilateral 2019   at [facility] History  Problem Relation Age of Onset  . Hypertension Mother   . Hypertension Father    Social History   Socioeconomic History  . Marital status: Married    Spouse name: Not on file  . Number of children: Not on file  . Years of education: Not on file  . Highest education level: Not on file  Occupational History  . Occupation: retired  Tobacco Use  . Smoking status: Never Smoker  . Smokeless tobacco: Never Used  Substance and Sexual Activity  . Alcohol use: Yes    Comment: occasionally   . Drug use: Not Currently  . Sexual activity: Yes  Other Topics Concern  . Not on file  Social History Narrative  . Not on file   Social Determinants of Health   Financial Resource Strain: Low Risk   . Difficulty of Paying Living Expenses: Not hard at all  Food Insecurity: No Food Insecurity  . Worried About Charity fundraiser in the Last Year: Never true  . Ran Out of Food in the Last Year: Never true  Transportation Needs: No Transportation Needs  . Lack of Transportation (Medical): No  . Lack of Transportation (Non-Medical): No  Physical Activity: Sufficiently Active  . Days of Exercise per Week: 5 days  . Minutes of Exercise per Session: 40 min  Stress: No Stress Concern Present  . Feeling of Stress : Not at all  Social Connections:   .  Frequency of Communication with Friends and Family:   . Frequency of Social Gatherings with Friends and Family:   . Attends Religious Services:   . Active Member of Clubs or Organizations:   . Attends Archivist Meetings:   Marland Kitchen Marital Status:     Outpatient Encounter Medications as of 08/27/2019  Medication Sig  . benztropine (COGENTIN) 1 MG tablet Take 1 mg by mouth 2 (two) times daily. Take 1/2 a tablet  . buPROPion (WELLBUTRIN XL) 150 MG 24 hr tablet Take by mouth.  . hydrochlorothiazide (HYDRODIURIL) 25 MG tablet Take by mouth.  . loratadine (CLARITIN) 10 MG  tablet Take by mouth.  . risperiDONE (RISPERDAL) 1 MG tablet Take by mouth.  . tamsulosin (FLOMAX) 0.4 MG CAPS capsule Take 0.4 mg by mouth daily.   No facility-administered encounter medications on file as of 08/27/2019.    Activities of Daily Living In your present state of health, do you have any difficulty performing the following activities: 08/27/2019  Hearing? N  Vision? Y  Comment lots of floaters  Difficulty concentrating or making decisions? N  Walking or climbing stairs? N  Dressing or bathing? N  Doing errands, shopping? N  Preparing Food and eating ? N  Using the Toilet? N  In the past six months, have you accidently leaked urine? N  Do you have problems with loss of bowel control? N  Managing your Medications? N  Managing your Finances? N  Housekeeping or managing your Housekeeping? N  Some recent data might be hidden    Patient Care Team: Glendale Chard, MD as PCP - General (Internal Medicine)   Assessment:   This is a routine wellness examination for Christopher Harvey.  Exercise Activities and Dietary recommendations Current Exercise Habits: Home exercise routine, Type of exercise: walking, Time (Minutes): 45, Frequency (Times/Week): 5, Weekly Exercise (Minutes/Week): 225  Goals    . Patient Stated (pt-stated)     To make it to the end of the year    . Patient Stated     08/27/2019, to stay healthy       Fall Risk Fall Risk  08/27/2019 02/20/2019 08/15/2018  Falls in the past year? 0 0 0  Risk for fall due to : Medication side effect - Medication side effect  Follow up Education provided;Falls evaluation completed;Falls prevention discussed - Education provided;Falls prevention discussed   Is the patient's home free of loose throw rugs in walkways, pet beds, electrical cords, etc?   yes      Grab bars in the bathroom? no      Handrails on the stairs?   yes      Adequate lighting?   yes  Timed Get Up and Go Performed: n/a  Depression Screen PHQ 2/9 Scores  08/27/2019 02/20/2019 08/15/2018  PHQ - 2 Score 0 0 0  PHQ- 9 Score 0 - 0    Cognitive Function     6CIT Screen 08/27/2019 08/15/2018  What Year? 0 points 0 points  What month? 0 points 0 points  What time? 0 points 0 points  Count back from 20 0 points 0 points  Months in reverse 0 points 0 points  Repeat phrase 6 points 0 points  Total Score 6 0    Immunization History  Administered Date(s) Administered  . Influenza-Unspecified 02/19/2019  . PFIZER SARS-COV-2 Vaccination 07/22/2019, 08/12/2019  . Pneumococcal Conjugate-13 03/10/2015  . Pneumococcal Polysaccharide-23 10/04/2017  . Tdap 06/30/2009  . Zoster 09/08/2015  . Zoster Recombinat (Shingrix) 02/27/2019  Qualifies for Shingles Vaccine? yes  Screening Tests Health Maintenance  Topic Date Due  . TETANUS/TDAP  05/07/2023  . COLONOSCOPY  11/03/2027  . INFLUENZA VACCINE  Completed  . Hepatitis C Screening  Completed  . PNA vac Low Risk Adult  Completed   Cancer Screenings: Lung: Low Dose CT Chest recommended if Age 74-80 years, 30 pack-year currently smoking OR have quit w/in 15years. Patient does not qualify. Colorectal: up to date  Additional Screenings:  Hepatitis C Screening:11/26/2013      Plan:    Patient would like to stay healthy.  I have personally reviewed and noted the following in the patient's chart:   . Medical and social history . Use of alcohol, tobacco or illicit drugs  . Current medications and supplements . Functional ability and status . Nutritional status . Physical activity . Advanced directives . List of other physicians . Hospitalizations, surgeries, and ER visits in previous 12 months . Vitals . Screenings to include cognitive, depression, and falls . Referrals and appointments  In addition, I have reviewed and discussed with patient certain preventive protocols, quality metrics, and best practice recommendations. A written personalized care plan for preventive services as well  as general preventive health recommendations were provided to patient.     Kellie Simmering, LPN  [phone]

## 2019-08-27 NOTE — Patient Instructions (Signed)

## 2019-08-28 LAB — CMP14+EGFR
ALT: 30 IU/L (ref 0–44)
AST: 23 IU/L (ref 0–40)
Albumin/Globulin Ratio: 1.5 (ref 1.2–2.2)
Albumin: 4.4 g/dL (ref 3.7–4.7)
Alkaline Phosphatase: 68 IU/L (ref 39–117)
BUN/Creatinine Ratio: 12 (ref 10–24)
BUN: 13 mg/dL (ref 8–27)
Bilirubin Total: 0.5 mg/dL (ref 0.0–1.2)
CO2: 24 mmol/L (ref 20–29)
Calcium: 9.6 mg/dL (ref 8.6–10.2)
Chloride: 104 mmol/L (ref 96–106)
Creatinine, Ser: 1.06 mg/dL (ref 0.76–1.27)
GFR calc Af Amer: 81 mL/min/{1.73_m2} (ref >59)
GFR calc non Af Amer: 70 mL/min/{1.73_m2} (ref >59)
Globulin, Total: 3 g/dL (ref 1.5–4.5)
Glucose: 91 mg/dL (ref 65–99)
Potassium: 4.5 mmol/L (ref 3.5–5.2)
Sodium: 142 mmol/L (ref 134–144)
Total Protein: 7.4 g/dL (ref 6.0–8.5)

## 2019-08-28 LAB — CBC
Hematocrit: 43.6 % (ref 37.5–51.0)
Hemoglobin: 15.1 g/dL (ref 13.0–17.7)
MCH: 33.3 pg — ABNORMAL HIGH (ref 26.6–33.0)
MCHC: 34.6 g/dL (ref 31.5–35.7)
MCV: 96 fL (ref 79–97)
Platelets: 220 10*3/uL (ref 150–450)
RBC: 4.53 x10E6/uL (ref 4.14–5.80)
RDW: 11.8 % (ref 11.6–15.4)
WBC: 6.7 10*3/uL (ref 3.4–10.8)

## 2019-08-28 LAB — LIPID PANEL
Chol/HDL Ratio: 3.9 ratio (ref 0.0–5.0)
Cholesterol, Total: 166 mg/dL (ref 100–199)
HDL: 43 mg/dL (ref >39)
LDL Chol Calc (NIH): 103 mg/dL — ABNORMAL HIGH (ref 0–99)
Triglycerides: 110 mg/dL (ref 0–149)
VLDL Cholesterol Cal: 20 mg/dL (ref 5–40)

## 2019-08-28 LAB — PSA: Prostate Specific Ag, Serum: 3.5 ng/mL (ref 0.0–4.0)

## 2019-08-30 NOTE — Progress Notes (Signed)
This visit occurred during the SARS-CoV-2 public health emergency.  Safety protocols were in place, including screening questions prior to the visit, additional usage of staff PPE, and extensive cleaning of exam room while observing appropriate contact time as indicated for disinfecting solutions.  Subjective:     Patient ID: Christopher Harvey , male    DOB: [birth year] , 74 y.o.   MRN: [id number]   Chief Complaint  Patient presents with  . Annual Exam  . Hypertension    HPI  He is here today for a full physical examination. He is also followed at the [name]. [facility]. He is being followed by HTN and BPH.   Hypertension This is a chronic problem. The current episode started more than 1 year ago. The problem has been gradually improving since onset. The problem is controlled. Pertinent negatives include no blurred vision, chest pain, palpitations or shortness of breath. Risk factors for coronary artery disease include obesity and male gender. The current treatment provides moderate improvement. Compliance problems include exercise.      Past Medical History:  Diagnosis Date  . Hypertension   . PTSD (post-traumatic stress disorder)      Family History  Problem Relation Age of Onset  . Hypertension Mother   . Hypertension Father      Current Outpatient Medications:  .  benztropine (COGENTIN) 1 MG tablet, Take 1 mg by mouth 2 (two) times daily. Take 1/2 a tablet, Disp: , Rfl:  .  buPROPion (WELLBUTRIN XL) 150 MG 24 hr tablet, Take by mouth., Disp: , Rfl:  .  hydrochlorothiazide (HYDRODIURIL) 25 MG tablet, Take by mouth., Disp: , Rfl:  .  loratadine (CLARITIN) 10 MG tablet, Take by mouth., Disp: , Rfl:  .  risperiDONE (RISPERDAL) 1 MG tablet, Take by mouth., Disp: , Rfl:  .  tamsulosin (FLOMAX) 0.4 MG CAPS capsule, Take 0.4 mg by mouth daily., Disp: , Rfl:    No Known Allergies   Review of Systems  Constitutional: Negative.   HENT: Negative.    Eyes: Negative.  Negative for blurred vision.  Respiratory: Negative.  Negative for shortness of breath.   Cardiovascular: Negative.  Negative for chest pain and palpitations.  Endocrine: Negative.   Genitourinary: Negative.   Musculoskeletal: Positive for arthralgias.       He c/o b/l foot pain. There is pain with ambulation. Denies fall/trauma. This has affected his ability to walk for exercise.   Skin: Negative.   Allergic/Immunologic: Negative.   Neurological: Negative.   Hematological: Negative.   Psychiatric/Behavioral: Negative.      Today's Vitals   08/27/19 1436  BP: 124/80  Pulse: 82  Temp: 98.7 F (37.1 C)  Weight: 239 lb (108.4 kg)  Height: 5' 8.8" (1.748 m)   Body mass index is 35.5 kg/m.   Objective:  Physical Exam Vitals and nursing note reviewed.  Constitutional:      Appearance: Normal appearance.  HENT:     Head: Normocephalic and atraumatic.     Right Ear: Tympanic membrane, ear canal and external ear normal.     Left Ear: Tympanic membrane, ear canal and external ear normal.     Nose:     Comments: Deferred, masked    Mouth/Throat:     Comments: Deferred, masked Eyes:     Extraocular Movements: Extraocular movements intact.     Conjunctiva/sclera: Conjunctivae normal.     Pupils: Pupils are equal, round, and reactive to light.  Cardiovascular:  Rate and Rhythm: Normal rate and regular rhythm.     Pulses: Normal pulses.     Heart sounds: Normal heart sounds.  Pulmonary:     Effort: Pulmonary effort is normal.     Breath sounds: Normal breath sounds.  Chest:     Breasts:        Right: Normal. No swelling, bleeding, inverted nipple, mass or nipple discharge.        Left: Normal. No swelling, bleeding, inverted nipple, mass or nipple discharge.  Abdominal:     General: Bowel sounds are normal.     Palpations: Abdomen is soft.     Comments: Rounded, soft.   Musculoskeletal:        General: Normal range of motion.     Cervical back:  Normal range of motion and neck supple.  Skin:    General: Skin is warm.  Neurological:     General: No focal deficit present.     Mental Status: He is alert.  Psychiatric:        Mood and Affect: Mood normal.        Behavior: Behavior normal.         Assessment And Plan:     1. Routine general medical examination at health care facility  PE performed. DRE deferred. PATIENT IS ADVISED TO GET 30-45 MINUTES REGULAR EXERCISE NO LESS THAN FOUR TO FIVE DAYS PER WEEK - BOTH WEIGHTBEARING EXERCISES AND AEROBIC ARE RECOMMENDED.  HE IS ADVISED TO FOLLOW A HEALTHY DIET WITH AT LEAST SIX FRUITS/VEGGIES PER DAY, DECREASE INTAKE OF RED MEAT, AND TO INCREASE FISH INTAKE TO TWO DAYS PER WEEK.  MEATS/FISH SHOULD NOT BE FRIED, BAKED OR BROILED IS PREFERABLE.  I SUGGEST WEARING SPF 50 SUNSCREEN ON EXPOSED PARTS AND ESPECIALLY WHEN IN THE DIRECT SUNLIGHT FOR AN EXTENDED PERIOD OF TIME.  PLEASE AVOID FAST FOOD RESTAURANTS AND INCREASE YOUR WATER INTAKE.   2. Parenchymal renal hypertension, stage 1 through stage 4 or unspecified chronic kidney disease  Chronic, well controlled. He will continue with current meds. He is encouraged to avoid adding salt to his foods. EKG performed, NSR w/ nonspecific T wave abnormality. He will rto in six months for re-evaluation.   - CMP14+EGFR - CBC - Lipid panel - EKG 12-Lead  3. Chronic renal disease, stage II  Chronic, this has been stable. I will check GFR, Cr today. He is encouraged to stay well hydrated.   4. Bilateral foot pain  He agrees to Podiatry for further evaluation. He was advised that he would likely benefit from wearing orthotics.   - Ambulatory referral to Podiatry  5. Benign prostatic hyperplasia with post-void dribbling  Chronic. He was recently prescribed Flomax by the [name]. he is advised to take meds as prescribed. DRE deferred.   - PSA  6. Class 2 severe obesity due to excess calories with serious comorbidity and body mass index (BMI) of  35.0 to 35.9 in adult [facility])  He is encouraged to strive for BMI less than 30 to decrease cardiac risk. Advised to aim for at least 150 minutes of exercise per week.   [name], MD    THE PATIENT IS ENCOURAGED TO PRACTICE SOCIAL DISTANCING DUE TO THE COVID-19 PANDEMIC.

## 2020-03-03 ENCOUNTER — Ambulatory Visit (INDEPENDENT_AMBULATORY_CARE_PROVIDER_SITE_OTHER): Payer: Medicare Other | Admitting: Internal Medicine

## 2020-03-03 ENCOUNTER — Encounter: Payer: Self-pay | Admitting: Internal Medicine

## 2020-03-03 ENCOUNTER — Other Ambulatory Visit: Payer: Self-pay

## 2020-03-03 VITALS — BP 122/68 | HR 88 | Temp 98.0°F | Ht 67.8 in | Wt 236.0 lb

## 2020-03-03 DIAGNOSIS — E559 Vitamin D deficiency, unspecified: Secondary | ICD-10-CM | POA: Diagnosis not present

## 2020-03-03 DIAGNOSIS — Z23 Encounter for immunization: Secondary | ICD-10-CM

## 2020-03-03 DIAGNOSIS — N182 Chronic kidney disease, stage 2 (mild): Secondary | ICD-10-CM | POA: Diagnosis not present

## 2020-03-03 DIAGNOSIS — M79672 Pain in left foot: Secondary | ICD-10-CM | POA: Diagnosis not present

## 2020-03-03 DIAGNOSIS — I129 Hypertensive chronic kidney disease with stage 1 through stage 4 chronic kidney disease, or unspecified chronic kidney disease: Secondary | ICD-10-CM | POA: Diagnosis not present

## 2020-03-03 DIAGNOSIS — Z6836 Body mass index (BMI) 36.0-36.9, adult: Secondary | ICD-10-CM

## 2020-03-03 DIAGNOSIS — R351 Nocturia: Secondary | ICD-10-CM

## 2020-03-03 NOTE — Patient Instructions (Signed)

## 2020-03-03 NOTE — Progress Notes (Signed)
I,Tianna Badgett,acting as a Education administrator for [name], MD.,have documented all relevant documentation on the behalf of [name], MD,as directed by  [name], MD while in the presence of [name], MD.  This visit occurred during the SARS-CoV-2 public health emergency.  Safety protocols were in place, including screening questions prior to the visit, additional usage of staff PPE, and extensive cleaning of exam room while observing appropriate contact time as indicated for disinfecting solutions.  Subjective:     Patient ID: Christopher Harvey , male    DOB: January 29, 1946 , 74 y.o.   MRN: [id number]   Chief Complaint  Patient presents with  . Hypertension    HPI  Patient is here for htn follow up. He states that he is compliant with medication. He denies having any headaches, chest pain and shortness of breath.   Hypertension This is a chronic problem. The current episode started more than 1 year ago. The problem has been gradually improving since onset. The problem is uncontrolled. Pertinent negatives include no blurred vision, chest pain, palpitations or shortness of breath. Past treatments include diuretics. The current treatment provides moderate improvement. Compliance problems include exercise.  Hypertensive end-organ damage includes kidney disease.     Past Medical History:  Diagnosis Date  . Hypertension   . PTSD (post-traumatic stress disorder)      Family History  Problem Relation Age of Onset  . Hypertension Mother   . Hypertension Father      Current Outpatient Medications:  .  benztropine (COGENTIN) 1 MG tablet, Take 1 mg by mouth 2 (two) times daily. Take 1/2 a tablet, Disp: , Rfl:  .  buPROPion (WELLBUTRIN XL) 150 MG 24 hr tablet, Take by mouth., Disp: , Rfl:  .  hydrochlorothiazide (HYDRODIURIL) 25 MG tablet, Take by mouth., Disp: , Rfl:  .  loratadine (CLARITIN) 10 MG tablet, Take by mouth., Disp: , Rfl:  .  risperiDONE (RISPERDAL) 1 MG tablet,  Take by mouth., Disp: , Rfl:  .  tamsulosin (FLOMAX) 0.4 MG CAPS capsule, Take 0.4 mg by mouth daily., Disp: , Rfl:    No Known Allergies   Review of Systems  Constitutional: Negative.   Eyes: Negative for blurred vision.  Respiratory: Negative.  Negative for shortness of breath.   Cardiovascular: Negative.  Negative for chest pain and palpitations.  Gastrointestinal: Negative.   Musculoskeletal: Positive for arthralgias.       He c/o left foot pain. Denies fall/trauma. There is some pain with ambulation. Usually triggered by walking. Denies LE weakness/paresthesias.   Neurological: Negative.      Today's Vitals   03/03/20 1431  BP: 122/68  Pulse: 88  Temp: 98 F (36.7 C)  TempSrc: Oral  Weight: 236 lb (107 kg)  Height: 5' 7.8" (1.722 m)   Body mass index is 36.1 kg/m.   Objective:  Physical Exam Vitals and nursing note reviewed.  Constitutional:      Appearance: Normal appearance. He is obese.  Cardiovascular:     Rate and Rhythm: Normal rate and regular rhythm.     Heart sounds: Normal heart sounds.  Pulmonary:     Effort: Pulmonary effort is normal.     Breath sounds: Normal breath sounds.  Skin:    General: Skin is warm.  Neurological:     General: No focal deficit present.     Mental Status: He is alert and oriented to person, place, and time.  Psychiatric:  Mood and Affect: Mood normal.         Assessment And Plan:     1. Parenchymal renal hypertension, stage 1 through stage 4 or unspecified chronic kidney disease Comments: Chronic, well controlled. He is also followed at the [name]. He is encouraged to avoid adding salt to her foods. I will check labs as listed below. He will rto in six months for a physical exam.  - CMP14+EGFR  2. Chronic renal disease, stage II Comments: Chronic, I will check a GFR, Cr today. He is encouraged to stay well hydrated and keep BP well controlled.   3. Left foot pain Comments: I will refer him to Podiatry for further  evaluation and radiographic studies. He is in agreement with his treatment plan. He may benefit from orthotics.  - Ambulatory referral to Podiatry  4. Nocturia Comments: He expresses interest in having his prostate levels checked. He also has this followed by the [name]. He is concerned b/c he has a friend recently dx'd w/ prostate cancer. I will check PSA as requested. March 2021 results were 3.5.  - PSA, total and free  5. Vitamin D deficiency disease Comments: I will check a vitamin D level and supplement as needed.  - Vitamin D (25 hydroxy)  6. Class 2 severe obesity due to excess calories with serious comorbidity and body mass index (BMI) of 36.0 to 36.9 in adult [facility]) Comments: He is encouraged to strive for BMI less than 30 to decrease cardiac risk. Advised to aim for at least 150 minutes of exercise per week.   7. Need for influenza vaccination Comments: He was given high dose flu vaccine to update his immunization history.  - Flu Vaccine QUAD High Dose(Fluad)       Patient was given opportunity to ask questions. Patient verbalized understanding of the plan and was able to repeat key elements of the plan. All questions were answered to their satisfaction.  [name], MD   I, [name], MD, have reviewed all documentation for this visit. The documentation on 03/06/20 for the exam, diagnosis, procedures, and orders are all accurate and complete.  THE PATIENT IS ENCOURAGED TO PRACTICE SOCIAL DISTANCING DUE TO THE COVID-19 PANDEMIC.

## 2020-03-04 LAB — CMP14+EGFR
ALT: 30 IU/L (ref 0–44)
AST: 20 IU/L (ref 0–40)
Albumin/Globulin Ratio: 1.6 (ref 1.2–2.2)
Albumin: 4.3 g/dL (ref 3.7–4.7)
Alkaline Phosphatase: 67 IU/L (ref 44–121)
BUN/Creatinine Ratio: 14 (ref 10–24)
BUN: 15 mg/dL (ref 8–27)
Bilirubin Total: 0.3 mg/dL (ref 0.0–1.2)
CO2: 25 mmol/L (ref 20–29)
Calcium: 9.4 mg/dL (ref 8.6–10.2)
Chloride: 102 mmol/L (ref 96–106)
Creatinine, Ser: 1.09 mg/dL (ref 0.76–1.27)
GFR calc Af Amer: 77 mL/min/{1.73_m2} (ref >59)
GFR calc non Af Amer: 67 mL/min/{1.73_m2} (ref >59)
Globulin, Total: 2.7 g/dL (ref 1.5–4.5)
Glucose: 100 mg/dL — ABNORMAL HIGH (ref 65–99)
Potassium: 4.2 mmol/L (ref 3.5–5.2)
Sodium: 140 mmol/L (ref 134–144)
Total Protein: 7 g/dL (ref 6.0–8.5)

## 2020-03-04 LAB — PSA, TOTAL AND FREE
PSA, Free Pct: 19.9 %
PSA, Free: 1.63 ng/mL
Prostate Specific Ag, Serum: 8.2 ng/mL — ABNORMAL HIGH (ref 0.0–4.0)

## 2020-03-04 LAB — VITAMIN D 25 HYDROXY (VIT D DEFICIENCY, FRACTURES): Vit D, 25-Hydroxy: 39.7 ng/mL (ref 30.0–100.0)

## 2020-03-22 ENCOUNTER — Other Ambulatory Visit: Payer: Self-pay

## 2020-03-22 DIAGNOSIS — R972 Elevated prostate specific antigen [PSA]: Secondary | ICD-10-CM

## 2020-03-25 ENCOUNTER — Ambulatory Visit (INDEPENDENT_AMBULATORY_CARE_PROVIDER_SITE_OTHER): Payer: Medicare Other

## 2020-03-25 ENCOUNTER — Other Ambulatory Visit: Payer: Self-pay

## 2020-03-25 ENCOUNTER — Ambulatory Visit (INDEPENDENT_AMBULATORY_CARE_PROVIDER_SITE_OTHER): Payer: Medicare Other | Admitting: Sports Medicine

## 2020-03-25 ENCOUNTER — Encounter: Payer: Self-pay | Admitting: Sports Medicine

## 2020-03-25 DIAGNOSIS — M7752 Other enthesopathy of left foot: Secondary | ICD-10-CM

## 2020-03-25 DIAGNOSIS — M779 Enthesopathy, unspecified: Secondary | ICD-10-CM | POA: Diagnosis not present

## 2020-03-25 DIAGNOSIS — M79672 Pain in left foot: Secondary | ICD-10-CM

## 2020-03-25 DIAGNOSIS — M7750 Other enthesopathy of unspecified foot: Secondary | ICD-10-CM

## 2020-03-25 MED ORDER — METHYLPREDNISOLONE 4 MG PO TBPK: ORAL_TABLET | ORAL | 0 refills | Status: DC

## 2020-03-25 NOTE — Progress Notes (Signed)
Subjective: Christopher Harvey is a 74 y.o. male patient who presents to office for evaluation of left foot pain at the midfoot and along the side that has gotten worse over the last 3 months, worse with walking, has history of achilles tear in 2011 that he did not have surgery for. No other issues noted.  Review of Systems  All other systems reviewed and are negative.   Patient Active Problem List   Diagnosis Date Noted  . Class 2 severe obesity due to excess calories with serious comorbidity and body mass index (BMI) of 36.0 to 36.9 in adult [facility]) 09/01/2018  . Chronic post-traumatic stress disorder (PTSD) 09/01/2018  . Essential hypertension 09/01/2018    Current Outpatient Medications on File Prior to Visit  Medication Sig Dispense Refill  . benztropine (COGENTIN) 1 MG tablet Take 1 mg by mouth 2 (two) times daily. Take 1/2 a tablet    . buPROPion (WELLBUTRIN XL) 150 MG 24 hr tablet Take by mouth.    . hydrochlorothiazide (HYDRODIURIL) 25 MG tablet Take by mouth.    . loratadine (CLARITIN) 10 MG tablet Take by mouth.    . risperiDONE (RISPERDAL) 1 MG tablet Take by mouth.    . tamsulosin (FLOMAX) 0.4 MG CAPS capsule Take 0.4 mg by mouth daily.     No current facility-administered medications on file prior to visit.    No Known Allergies  Objective:  General: Alert and oriented x3 in no acute distress  Dermatology: No open lesions bilateral lower extremities, no webspace macerations, no ecchymosis bilateral, all nails x 10 are well manicured.  Vascular: Dorsalis Pedis and Posterior Tibial pedal pulses 1/4, Capillary Fill Time 3 seconds, + pedal hair growth bilateral, no edema bilateral lower extremities, Temperature gradient within normal limits.  Neurology: Johney Maine sensation intact via light touch bilateral.  Musculoskeletal: Mild tenderness with palpation at dorsolateral midfoot on left at peroneal tendon insertion on left, No pain to insertion of achilles on left,The achilles  tendon feels intact with no nodularity or palpable dell, Thompson sign negative, + contracture of achilles on left.   Gait: minimally antalgic   Xrays  Left Foot    Impression: Normal osseous mineralization. Joint spaces preserved. No fracture/dislocation/boney destruction. Calcaneal spur present. Kager's triangle intact with no obliteration. No soft tissue abnormalities or radiopaque foreign bodies.   Assessment and Plan: Problem List Items Addressed This Visit    None    Visit Diagnoses    Pain in left foot    -  Primary   Relevant Orders   DG Foot Complete Left   Tendonitis          -Complete examination performed -Xrays reviewed -Discussed treatement options for likely  tendonitis  -Rx medrol dose pack -Advised gentle stretching with use of night splint as rx'd -Recommend good supportive shoes with heel lift -No improvement will consider MRI/PT/EPAT -Patient to return to office in 3-4 weeks or sooner if condition worsens.  Landis Martins, DPM

## 2020-03-25 NOTE — Progress Notes (Signed)
D

## 2020-03-26 ENCOUNTER — Other Ambulatory Visit: Payer: Self-pay | Admitting: Sports Medicine

## 2020-03-26 DIAGNOSIS — M7750 Other enthesopathy of unspecified foot: Secondary | ICD-10-CM

## 2020-03-29 ENCOUNTER — Encounter (HOSPITAL_BASED_OUTPATIENT_CLINIC_OR_DEPARTMENT_OTHER): Payer: Self-pay | Admitting: Emergency Medicine

## 2020-03-29 ENCOUNTER — Emergency Department (HOSPITAL_BASED_OUTPATIENT_CLINIC_OR_DEPARTMENT_OTHER): Payer: Medicare Other

## 2020-03-29 ENCOUNTER — Emergency Department (HOSPITAL_BASED_OUTPATIENT_CLINIC_OR_DEPARTMENT_OTHER)
Admission: EM | Admit: 2020-03-29 | Discharge: 2020-03-29 | Disposition: A | Discharge status: Home / Self Care | Payer: Medicare Other | Attending: Emergency Medicine | Admitting: Emergency Medicine

## 2020-03-29 ENCOUNTER — Other Ambulatory Visit: Payer: Self-pay

## 2020-03-29 DIAGNOSIS — S0101XA Laceration without foreign body of scalp, initial encounter: Secondary | ICD-10-CM | POA: Insufficient documentation

## 2020-03-29 DIAGNOSIS — G319 Degenerative disease of nervous system, unspecified: Secondary | ICD-10-CM | POA: Diagnosis not present

## 2020-03-29 DIAGNOSIS — Z79899 Other long term (current) drug therapy: Secondary | ICD-10-CM | POA: Diagnosis not present

## 2020-03-29 DIAGNOSIS — Y9289 Other specified places as the place of occurrence of the external cause: Secondary | ICD-10-CM | POA: Diagnosis not present

## 2020-03-29 DIAGNOSIS — Z23 Encounter for immunization: Secondary | ICD-10-CM | POA: Diagnosis not present

## 2020-03-29 DIAGNOSIS — Y9389 Activity, other specified: Secondary | ICD-10-CM | POA: Diagnosis not present

## 2020-03-29 DIAGNOSIS — I10 Essential (primary) hypertension: Secondary | ICD-10-CM | POA: Insufficient documentation

## 2020-03-29 DIAGNOSIS — S0000XA Unspecified superficial injury of scalp, initial encounter: Secondary | ICD-10-CM | POA: Diagnosis present

## 2020-03-29 DIAGNOSIS — W010XXA Fall on same level from slipping, tripping and stumbling without subsequent striking against object, initial encounter: Secondary | ICD-10-CM | POA: Insufficient documentation

## 2020-03-29 DIAGNOSIS — S0990XA Unspecified injury of head, initial encounter: Secondary | ICD-10-CM | POA: Diagnosis not present

## 2020-03-29 DIAGNOSIS — G9389 Other specified disorders of brain: Secondary | ICD-10-CM | POA: Diagnosis not present

## 2020-03-29 DIAGNOSIS — W19XXXA Unspecified fall, initial encounter: Secondary | ICD-10-CM

## 2020-03-29 MED ORDER — LIDOCAINE HCL (PF) 1 % IJ SOLN
5.0000 mL | Freq: Once | INTRAMUSCULAR | Status: AC
  Administered 2020-03-29: 5 mL
  Filled 2020-03-29: qty 5

## 2020-03-29 MED ORDER — TETANUS-DIPHTH-ACELL PERTUSSIS 5-2.5-18.5 LF-MCG/0.5 IM SUSY
0.5000 mL | PREFILLED_SYRINGE | Freq: Once | INTRAMUSCULAR | Status: AC
  Administered 2020-03-29: 0.5 mL via INTRAMUSCULAR
  Filled 2020-03-29: qty 0.5

## 2020-03-29 NOTE — ED Provider Notes (Signed)
..  Laceration Repair  Date/Time: 03/29/2020 9:45 PM Performed by: Jacqlyn Larsen, PA-C Authorized by: Jacqlyn Larsen, PA-C   Consent:    Consent obtained:  Verbal   Consent given by:  Patient   Risks discussed:  Infection, pain, poor cosmetic result, poor wound healing and need for additional repair   Alternatives discussed:  No treatment Anesthesia (see MAR for exact dosages):    Anesthesia method:  Local infiltration   Local anesthetic:  Lidocaine 2% WITH epi Laceration details:    Location:  Scalp   Scalp location:  R parietal   Length (cm):  3 Repair type:    Repair type:  Simple Pre-procedure details:    Preparation:  Patient was prepped and draped in usual sterile fashion Exploration:    Hemostasis achieved with:  Epinephrine and direct pressure   Wound exploration: entire depth of wound probed and visualized     Wound extent: areolar tissue violated   Treatment:    Area cleansed with:  Saline   Amount of cleaning:  Standard   Irrigation solution:  Sterile saline Skin repair:    Repair method:  Staples   Number of staples:  2 Approximation:    Approximation:  Close Post-procedure details:    Dressing:  Open (no dressing)   Patient tolerance of procedure:  Tolerated well, no immediate complications Comments:     Laceration repaired by PA student with my direct supervision      Janet Berlin 03/29/20 2154    Truddie Hidden, MD 03/29/20 2220

## 2020-03-29 NOTE — ED Triage Notes (Addendum)
Pt fell backwards off stool hitting head on the corner of wall. Pt denies loc, nausea, and dizziness. Pt has small laceration to back of head, bleeding control

## 2020-03-29 NOTE — ED Provider Notes (Signed)
[facility] Provider Note  CSN: [id number] Arrival date & time: 03/29/20 1909    History Chief Complaint  Patient presents with  . Fall    HPI  Christopher Harvey is a 74 y.o. male with history of HTN reports he was sitting on a low stool, trying to take off his muddy shoes when the stool began slipping on the wood floor and he fell backwards hitting his head on the wall. He was not unconscious but did 'see stars' for a few seconds. Sustained a wound to his R parietal-occipital scalp but denies any other injuries. TDAP >5years.    Past Medical History:  Diagnosis Date  . Hypertension   . PTSD (post-traumatic stress disorder)     Past Surgical History:  Procedure Laterality Date  . CATARACT EXTRACTION, BILATERAL Bilateral 2019   at [place] History  Problem Relation Age of Onset  . Hypertension Mother   . Hypertension Father     Social History   Tobacco Use  . Smoking status: Never Smoker  . Smokeless tobacco: Never Used  Vaping Use  . Vaping Use: Never used  Substance Use Topics  . Alcohol use: Yes    Comment: occasionally   . Drug use: Not Currently     Home Medications Prior to Admission medications   Medication Sig Start Date End Date Taking? Authorizing Provider  benztropine (COGENTIN) 1 MG tablet Take 1 mg by mouth 2 (two) times daily. Take 1/2 a tablet    [provider]  buPROPion (WELLBUTRIN XL) 150 MG 24 hr tablet Take by mouth.    [provider]  hydrochlorothiazide (HYDRODIURIL) 25 MG tablet Take by mouth.    [provider]  loratadine (CLARITIN) 10 MG tablet Take by mouth.    [provider]  methylPREDNISolone (MEDROL DOSEPAK) 4 MG TBPK tablet Take as directed 03/25/20   Landis Martins, DPM  risperiDONE (RISPERDAL) 1 MG tablet Take by mouth.    [provider]  tamsulosin (FLOMAX) 0.4 MG CAPS capsule Take 0.4 mg by mouth daily.    [provider]      Allergies    Patient has no known allergies.   Review of Systems   Review of Systems A comprehensive review of systems was completed and negative except as noted in HPI.    Physical Exam BP 139/84 (BP Location: Right Arm)   Pulse 81   Temp 98.8 F (37.1 C) (Oral)   Resp 17   Ht 5\' 10"  (1.778 m)   Wt 104.3 kg   SpO2 100%   BMI 33.00 kg/m   Physical Exam Vitals and nursing note reviewed.  Constitutional:      Appearance: Normal appearance.  HENT:     Head: Normocephalic.     Comments: Wound to R posterior scalp, will need cleaning to determine depth and length    Nose: Nose normal.     Mouth/Throat:     Mouth: Mucous membranes are moist.  Eyes:     Extraocular Movements: Extraocular movements intact.     Conjunctiva/sclera: Conjunctivae normal.  Cardiovascular:     Rate and Rhythm: Normal rate.  Pulmonary:     Effort: Pulmonary effort is normal.     Breath sounds: Normal breath sounds.  Abdominal:     General: Abdomen is flat.     Palpations: Abdomen is soft.     Tenderness: There is no abdominal tenderness.  Musculoskeletal:  General: No swelling. Normal range of motion.     Cervical back: Neck supple. No rigidity or tenderness.  Skin:    General: Skin is warm and dry.  Neurological:     General: No focal deficit present.     Mental Status: He is alert.  Psychiatric:        Mood and Affect: Mood normal.      ED Results / Procedures / Treatments   Labs (all labs ordered are listed, but only abnormal results are displayed) Labs Reviewed - No data to display  EKG None   Radiology CT Head Wo Contrast  Result Date: 03/29/2020 CLINICAL DATA:  Status post trauma. EXAM: CT HEAD WITHOUT CONTRAST TECHNIQUE: Contiguous axial images were obtained from the base of the skull through the vertex without intravenous contrast. COMPARISON:  None. FINDINGS: Brain: There is mild cerebral atrophy with widening of the extra-axial spaces and ventricular  dilatation. There are areas of decreased attenuation within the white matter tracts of the supratentorial brain, consistent with microvascular disease changes. Vascular: No hyperdense vessel or unexpected calcification. Skull: Normal. Negative for fracture or focal lesion. Sinuses/Orbits: No acute finding. Other: None. IMPRESSION: No acute intracranial pathology. Electronically Signed   By: Virgina Norfolk M.D.   On: 03/29/2020 19:42    Procedures Procedures  Medications Ordered in the ED Medications  Tdap (BOOSTRIX) injection 0.5 mL (0.5 mLs Intramuscular Given 03/29/20 2101)  lidocaine (PF) (XYLOCAINE) 1 % injection 5 mL (5 mLs Infiltration Given by Other 03/29/20 2104)     MDM Rules/Calculators/A&P MDM Patient with mechanical fall, head injury but no LOC. Not taking blood thinners. Plan CT, wound cleaning and repair if needed.  ED Course  I have reviewed the triage vital signs and the nursing notes.  Pertinent labs & imaging results that were available during my care of the patient were reviewed by me and considered in my medical decision making (see chart for details).  Clinical Course as of Mar 29 2302  Mon Mar 29, 2020  2016 Patient's CT is neg. Scalp wound cleaned and there is a 2.5cm laceration in need of repair.    [CS]  2130 Wound repaired by Richard Miu, PA Student. Patient given wound care instructions.    [CS]    Clinical Course User Index [CS] Truddie Hidden, MD    Final Clinical Impression(s) / ED Diagnoses Final diagnoses:  Fall, initial encounter  Injury of head, initial encounter  Laceration of scalp, initial encounter    Rx / DC Orders ED Discharge Orders    None       Truddie Hidden, MD 03/29/20 2303

## 2020-03-30 ENCOUNTER — Telehealth: Payer: Self-pay

## 2020-03-30 NOTE — Telephone Encounter (Signed)
The patient called to notify the office that he slipped and fell and had to get some staples in his head.  The pt said he went to [facility] [address] and was told that he can go back there to get the staples removed or go to his primary.  The pt said he wanted to come to his primary.  The appt was scheduled.  The pt said they did ct scan to check him out and he is ok.

## 2020-04-08 ENCOUNTER — Encounter: Payer: Self-pay | Admitting: Nurse Practitioner

## 2020-04-08 ENCOUNTER — Other Ambulatory Visit: Payer: Self-pay

## 2020-04-08 ENCOUNTER — Ambulatory Visit (INDEPENDENT_AMBULATORY_CARE_PROVIDER_SITE_OTHER): Payer: Medicare Other | Admitting: Nurse Practitioner

## 2020-04-08 VITALS — BP 134/80 | HR 84 | Temp 98.1°F | Ht 70.0 in | Wt 239.4 lb

## 2020-04-08 DIAGNOSIS — Z4802 Encounter for removal of sutures: Secondary | ICD-10-CM | POA: Diagnosis not present

## 2020-04-08 DIAGNOSIS — S0181XD Laceration without foreign body of other part of head, subsequent encounter: Secondary | ICD-10-CM | POA: Diagnosis not present

## 2020-04-08 NOTE — Progress Notes (Signed)
I,Yamilka Roman [employer] as a Education administrator for [employer], FNP.,have documented all relevant documentation on the behalf of Minette Brine, FNP,as directed by  Minette Brine, FNP while in the presence of Minette Brine, [place]. This visit occurred during the SARS-CoV-2 public health emergency.  Safety protocols were in place, including screening questions prior to the visit, additional usage of staff PPE, and extensive cleaning of exam room while observing appropriate contact time as indicated for disinfecting solutions.  Subjective:     Patient ID: Christopher Harvey , male    DOB: [birth year] , 74 y.o.   MRN: [id number]   Chief Complaint  Patient presents with  . Suture / Staple Removal    HPI  He is here today to have his staple removed.  While sitting on a stool with wheels while tying his shoes the stool slipped and he fell and hit his head requiring staples to be placed on 10/25.      Past Medical History:  Diagnosis Date  . Hypertension   . PTSD (post-traumatic stress disorder)      Family History  Problem Relation Age of Onset  . Hypertension Mother   . Hypertension Father      Current Outpatient Medications:  .  benztropine (COGENTIN) 1 MG tablet, Take 1 mg by mouth 2 (two) times daily. Take 1/2 a tablet, Disp: , Rfl:  .  buPROPion (WELLBUTRIN XL) 150 MG 24 hr tablet, Take by mouth., Disp: , Rfl:  .  hydrochlorothiazide (HYDRODIURIL) 25 MG tablet, Take by mouth., Disp: , Rfl:  .  loratadine (CLARITIN) 10 MG tablet, Take by mouth., Disp: , Rfl:  .  methylPREDNISolone (MEDROL DOSEPAK) 4 MG TBPK tablet, Take as directed, Disp: 21 tablet, Rfl: 0 .  risperiDONE (RISPERDAL) 1 MG tablet, Take by mouth., Disp: , Rfl:  .  tamsulosin (FLOMAX) 0.4 MG CAPS capsule, Take 0.4 mg by mouth daily., Disp: , Rfl:    No Known Allergies   Review of Systems  Constitutional: Negative.   Respiratory: Negative.   Cardiovascular: Negative.  Negative for chest pain, palpitations and leg swelling.   Skin:       Has 2 staples placed on 10/25  Neurological: Negative for dizziness and headaches.     Today's Vitals   04/08/20 1144  BP: 134/80  Pulse: 84  Temp: 98.1 F (36.7 C)  TempSrc: Oral  Weight: 239 lb 6.4 oz (108.6 kg)  Height: 5\' 10"  (1.778 m)  PainSc: 0-No pain   Body mass index is 34.35 kg/m.   Objective:  Physical Exam Vitals reviewed.  Constitutional:      General: He is not in acute distress.    Appearance: Normal appearance.  Pulmonary:     Effort: Pulmonary effort is normal. No respiratory distress.  Skin:    General: Skin is warm.     Comments: Healed laceration to right parietal area and removed 2 staples without difficulty  Neurological:     General: No focal deficit present.     Mental Status: He is alert and oriented to person, place, and time.     Cranial Nerves: No cranial nerve deficit.  Psychiatric:        Mood and Affect: Mood normal.        Behavior: Behavior normal.        Thought Content: Thought content normal.        Judgment: Judgment normal.         Assessment And Plan:     1. Laceration  of other part of head without foreign body, subsequent encounter  Had a fall on 10/25 and encountered a 3 mm laceration which had staples placed.  He is doing well without any complaints.  2. Encounter for staple removal  Removed 2 staples from right parietal area without difficulty.       Patient was given opportunity to ask questions. Patient verbalized understanding of the plan and was able to repeat key elements of the plan. All questions were answered to their satisfaction.  Minette Brine, FNP   I, Minette Brine, FNP, have reviewed all documentation for this visit. The documentation on 04/08/20 for the exam, diagnosis, procedures, and orders are all accurate and complete.  THE PATIENT IS ENCOURAGED TO PRACTICE SOCIAL DISTANCING DUE TO THE COVID-19 PANDEMIC.

## 2020-04-22 ENCOUNTER — Ambulatory Visit: Payer: Medicare Other | Admitting: Sports Medicine

## 2020-04-22 ENCOUNTER — Other Ambulatory Visit: Payer: Self-pay

## 2020-04-22 ENCOUNTER — Encounter: Payer: Self-pay | Admitting: Sports Medicine

## 2020-04-22 DIAGNOSIS — M79672 Pain in left foot: Secondary | ICD-10-CM

## 2020-04-22 DIAGNOSIS — M7752 Other enthesopathy of left foot: Secondary | ICD-10-CM

## 2020-04-22 DIAGNOSIS — M779 Enthesopathy, unspecified: Secondary | ICD-10-CM | POA: Diagnosis not present

## 2020-04-22 NOTE — Progress Notes (Signed)
  Subjective: Christopher Harvey is a 74 y.o. male patient who returns for f/u eval of left foot pain. Reports feels much better. Has a small amount of pain with eversion but otherwise is good.  Patient Active Problem List   Diagnosis Date Noted  . Class 2 severe obesity due to excess calories with serious comorbidity and body mass index (BMI) of 36.0 to 36.9 in adult [facility]) 09/01/2018  . Chronic post-traumatic stress disorder (PTSD) 09/01/2018  . Essential hypertension 09/01/2018    Current Outpatient Medications on File Prior to Visit  Medication Sig Dispense Refill  . benztropine (COGENTIN) 1 MG tablet Take 1 mg by mouth 2 (two) times daily. Take 1/2 a tablet    . buPROPion (WELLBUTRIN XL) 150 MG 24 hr tablet Take by mouth.    . hydrochlorothiazide (HYDRODIURIL) 25 MG tablet Take by mouth.    . loratadine (CLARITIN) 10 MG tablet Take by mouth.    . methylPREDNISolone (MEDROL DOSEPAK) 4 MG TBPK tablet Take as directed 21 tablet 0  . risperiDONE (RISPERDAL) 1 MG tablet Take by mouth.    . tamsulosin (FLOMAX) 0.4 MG CAPS capsule Take 0.4 mg by mouth daily.     No current facility-administered medications on file prior to visit.    No Known Allergies  Objective:  General: Alert and oriented x3 in no acute distress  Dermatology: No open lesions bilateral lower extremities, no webspace macerations, no ecchymosis bilateral, all nails x 10 are well manicured.  Vascular: Dorsalis Pedis and Posterior Tibial pedal pulses 1/4, Capillary Fill Time 3 seconds, + pedal hair growth bilateral, no edema bilateral lower extremities, Temperature gradient within normal limits.  Neurology: Johney Maine sensation intact via light touch bilateral.  Musculoskeletal: No tenderness with palpation at dorsolateral midfoot on left or at peroneal tendon insertion on left, No pain to insertion of achilles on left,The achilles tendon feels intact with no nodularity or palpable dell, Thompson sign negative, + contracture  of achilles on left.   Assessment and Plan: Problem List Items Addressed This Visit    None    Visit Diagnoses    Tendonitis    -  Primary   Pain in left foot          -Complete examination performed -Discussed continued care for resolving tendinitis -Advised continue with gentle stretching with use of night splint as rx'd -Dispensed fascial brace left foot and if works well may benefit from custom orthotics  -Recommend continue with good supportive shoes with heel lift -Return in 6 weeks or sooner if problems arise  Landis Martins, DPM

## 2020-04-26 DIAGNOSIS — R350 Frequency of micturition: Secondary | ICD-10-CM | POA: Diagnosis not present

## 2020-06-07 ENCOUNTER — Ambulatory Visit (INDEPENDENT_AMBULATORY_CARE_PROVIDER_SITE_OTHER): Payer: Medicare Other | Admitting: Internal Medicine

## 2020-06-07 ENCOUNTER — Other Ambulatory Visit: Payer: Self-pay

## 2020-06-07 ENCOUNTER — Encounter: Payer: Self-pay | Admitting: Internal Medicine

## 2020-06-07 VITALS — BP 144/88 | HR 81 | Temp 98.4°F | Ht 67.4 in | Wt 243.0 lb

## 2020-06-07 DIAGNOSIS — N182 Chronic kidney disease, stage 2 (mild): Secondary | ICD-10-CM

## 2020-06-07 DIAGNOSIS — Z6837 Body mass index (BMI) 37.0-37.9, adult: Secondary | ICD-10-CM

## 2020-06-07 DIAGNOSIS — I129 Hypertensive chronic kidney disease with stage 1 through stage 4 chronic kidney disease, or unspecified chronic kidney disease: Secondary | ICD-10-CM | POA: Diagnosis not present

## 2020-06-07 DIAGNOSIS — R972 Elevated prostate specific antigen [PSA]: Secondary | ICD-10-CM | POA: Diagnosis not present

## 2020-06-07 NOTE — Patient Instructions (Signed)
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
## 2020-06-10 ENCOUNTER — Ambulatory Visit: Payer: Medicare Other | Admitting: Sports Medicine

## 2020-08-03 LAB — LIPID PANEL
Cholesterol: 189 (ref 0–200)
HDL: 47 (ref 35–70)
LDL Cholesterol: 126
Triglycerides: 78 (ref 40–160)

## 2020-08-03 LAB — BASIC METABOLIC PANEL
BUN: 17 (ref 4–21)
CO2: 31 — AB (ref 13–22)
Chloride: 104 (ref 99–108)
Creatinine: 1.2 (ref 0.6–1.3)
Glucose: 126
Potassium: 3.8 (ref 3.4–5.3)
Sodium: 139 (ref 137–147)

## 2020-08-03 LAB — COMPREHENSIVE METABOLIC PANEL
Albumin: 3.9 (ref 3.5–5.0)
Calcium: 9.4 (ref 8.7–10.7)
GFR calc non Af Amer: >60

## 2020-08-03 LAB — HEPATIC FUNCTION PANEL
ALT: 40 (ref 10–40)
AST: 18 (ref 14–40)
Alkaline Phosphatase: 2.9 — AB (ref 25–125)
Alkaline Phosphatase: 74 (ref 25–125)
Bilirubin, Direct: 0.1 (ref 0.01–0.4)
Bilirubin, Total: 0.5

## 2020-08-03 LAB — CBC AND DIFFERENTIAL
HCT: 45 (ref 41–53)
Hemoglobin: 14.7 (ref 13.5–17.5)
Neutrophils Absolute: 42.8
Platelets: 194 (ref 150–399)
WBC: 6.4

## 2020-08-03 LAB — CBC: RBC: 4.58 (ref 3.87–5.11)

## 2020-08-03 LAB — VITAMIN B12: Vitamin B-12: 737

## 2020-08-03 LAB — HEMOGLOBIN A1C: Hemoglobin A1C: 5.1

## 2020-08-03 LAB — TSH: TSH: 2.17 (ref 0.41–5.90)

## 2020-08-03 LAB — VITAMIN D 25 HYDROXY (VIT D DEFICIENCY, FRACTURES): Vit D, 25-Hydroxy: 41.36

## 2020-08-03 LAB — PSA: PSA: 5.77

## 2020-09-08 ENCOUNTER — Ambulatory Visit (INDEPENDENT_AMBULATORY_CARE_PROVIDER_SITE_OTHER): Payer: Medicare Other

## 2020-09-08 ENCOUNTER — Other Ambulatory Visit: Payer: Self-pay

## 2020-09-08 ENCOUNTER — Encounter: Payer: Self-pay | Admitting: Internal Medicine

## 2020-09-08 ENCOUNTER — Ambulatory Visit (INDEPENDENT_AMBULATORY_CARE_PROVIDER_SITE_OTHER): Payer: Medicare Other | Admitting: Internal Medicine

## 2020-09-08 VITALS — BP 130/78 | HR 87 | Temp 97.9°F | Ht 68.6 in | Wt 245.4 lb

## 2020-09-08 VITALS — BP 130/78 | HR 87 | Temp 97.9°F | Ht 62.0 in | Wt 245.4 lb

## 2020-09-08 DIAGNOSIS — N182 Chronic kidney disease, stage 2 (mild): Secondary | ICD-10-CM

## 2020-09-08 DIAGNOSIS — Z6836 Body mass index (BMI) 36.0-36.9, adult: Secondary | ICD-10-CM

## 2020-09-08 DIAGNOSIS — Z0000 Encounter for general adult medical examination without abnormal findings: Secondary | ICD-10-CM

## 2020-09-08 DIAGNOSIS — I129 Hypertensive chronic kidney disease with stage 1 through stage 4 chronic kidney disease, or unspecified chronic kidney disease: Secondary | ICD-10-CM | POA: Diagnosis not present

## 2020-09-08 DIAGNOSIS — I10 Essential (primary) hypertension: Secondary | ICD-10-CM

## 2020-09-08 DIAGNOSIS — R7309 Other abnormal glucose: Secondary | ICD-10-CM | POA: Diagnosis not present

## 2020-09-08 LAB — POCT URINALYSIS DIPSTICK
Bilirubin, UA: NEGATIVE
Glucose, UA: NEGATIVE
Ketones, UA: NEGATIVE
Leukocytes, UA: NEGATIVE
Nitrite, UA: NEGATIVE
Protein, UA: NEGATIVE
Spec Grav, UA: 1.025 (ref 1.010–1.025)
Urobilinogen, UA: 0.2 E.U./dL
pH, UA: 5 (ref 5.0–8.0)

## 2020-09-08 LAB — POCT UA - MICROALBUMIN
Albumin/Creatinine Ratio, Urine, POC: <30
Creatinine, POC: 300 mg/dL
Microalbumin Ur, POC: 30 mg/L

## 2020-09-08 NOTE — Patient Instructions (Signed)
[name] , Thank you for taking time to come for your Medicare Wellness Visit. I appreciate your ongoing commitment to your health goals. Please review the following plan we discussed and let me know if I can assist you in the future.   Screening recommendations/referrals: Colonoscopy: completed 11/02/2017 Recommended yearly ophthalmology/optometry visit for glaucoma screening and checkup Recommended yearly dental visit for hygiene and checkup  Vaccinations: Influenza vaccine: completed 03/03/2020, due 01/03/2021 Pneumococcal vaccine: completed 10/04/2017 Tdap vaccine: completed 03/29/2020, due 03/29/2030 Shingles vaccine: completed   Covid-19:  03/16/2020, 08/12/2019, 07/22/2019  Advanced directives: Please bring a copy of your POA (Power of Attorney) and/or Living Will to your next appointment.   Conditions/risks identified: none  Next appointment: Follow up in one year for your annual wellness visit.   Preventive Care 63 Years and Older, Male Preventive care refers to lifestyle choices and visits with your health care provider that can promote health and wellness. What does preventive care include?  A yearly physical exam. This is also called an annual well check.  Dental exams once or twice a year.  Routine eye exams. Ask your health care provider how often you should have your eyes checked.  Personal lifestyle choices, including:  Daily care of your teeth and gums.  Regular physical activity.  Eating a healthy diet.  Avoiding tobacco and drug use.  Limiting alcohol use.  Practicing safe sex.  Taking low doses of aspirin every day.  Taking vitamin and mineral supplements as recommended by your health care provider. What happens during an annual well check? The services and screenings done by your health care provider during your annual well check will depend on your age, overall health, lifestyle risk factors, and family history of disease. Counseling  Your health care  provider may ask you questions about your:  Alcohol use.  Tobacco use.  Drug use.  Emotional well-being.  Home and relationship well-being.  Sexual activity.  Eating habits.  History of falls.  Memory and ability to understand (cognition).  Work and work Statistician. Screening  You may have the following tests or measurements:  Height, weight, and BMI.  Blood pressure.  Lipid and cholesterol levels. These may be checked every 5 years, or more frequently if you are over 21 years old.  Skin check.  Lung cancer screening. You may have this screening every year starting at age 77 if you have a 30-pack-year history of smoking and currently smoke or have quit within the past 15 years.  Fecal occult blood test (FOBT) of the stool. You may have this test every year starting at age 37.  Flexible sigmoidoscopy or colonoscopy. You may have a sigmoidoscopy every 5 years or a colonoscopy every 10 years starting at age 74.  Prostate cancer screening. Recommendations will vary depending on your family history and other risks.  Hepatitis C blood test.  Hepatitis B blood test.  Sexually transmitted disease (STD) testing.  Diabetes screening. This is done by checking your blood sugar (glucose) after you have not eaten for a while (fasting). You may have this done every 1-3 years.  Abdominal aortic aneurysm (AAA) screening. You may need this if you are a current or former smoker.  Osteoporosis. You may be screened starting at age 42 if you are at high risk. Talk with your health care provider about your test results, treatment options, and if necessary, the need for more tests. Vaccines  Your health care provider may recommend certain vaccines, such as:  Influenza vaccine. This  is recommended every year.  Tetanus, diphtheria, and acellular pertussis (Tdap, Td) vaccine. You may need a Td booster every 10 years.  Zoster vaccine. You may need this after age 70.  Pneumococcal  13-valent conjugate (PCV13) vaccine. One dose is recommended after age 1.  Pneumococcal polysaccharide (PPSV23) vaccine. One dose is recommended after age 37. Talk to your health care provider about which screenings and vaccines you need and how often you need them. This information is not intended to replace advice given to you by your health care provider. Make sure you discuss any questions you have with your health care provider. Document Released: 06/18/2015 Document Revised: 02/09/2016 Document Reviewed: 03/23/2015 Elsevier Interactive Patient Education  2017 [place] Prevention in the Home Falls can cause injuries. They can happen to people of all ages. There are many things you can do to make your home safe and to help prevent falls. What can I do on the outside of my home?  Regularly fix the edges of walkways and driveways and fix any cracks.  Remove anything that might make you trip as you walk through a door, such as a raised step or threshold.  Trim any bushes or trees on the path to your home.  Use bright outdoor lighting.  Clear any walking paths of anything that might make someone trip, such as rocks or tools.  Regularly check to see if handrails are loose or broken. Make sure that both sides of any steps have handrails.  Any raised decks and porches should have guardrails on the edges.  Have any leaves, snow, or ice cleared regularly.  Use sand or salt on walking paths during winter.  Clean up any spills in your garage right away. This includes oil or grease spills. What can I do in the bathroom?  Use night lights.  Install grab bars by the toilet and in the tub and shower. Do not use towel bars as grab bars.  Use non-skid mats or decals in the tub or shower.  If you need to sit down in the shower, use a plastic, non-slip stool.  Keep the floor dry. Clean up any water that spills on the floor as soon as it happens.  Remove soap buildup in the  tub or shower regularly.  Attach bath mats securely with double-sided non-slip rug tape.  Do not have throw rugs and other things on the floor that can make you trip. What can I do in the bedroom?  Use night lights.  Make sure that you have a light by your bed that is easy to reach.  Do not use any sheets or blankets that are too big for your bed. They should not hang down onto the floor.  Have a firm chair that has side arms. You can use this for support while you get dressed.  Do not have throw rugs and other things on the floor that can make you trip. What can I do in the kitchen?  Clean up any spills right away.  Avoid walking on wet floors.  Keep items that you use a lot in easy-to-reach places.  If you need to reach something above you, use a strong step stool that has a grab bar.  Keep electrical cords out of the way.  Do not use floor polish or wax that makes floors slippery. If you must use wax, use non-skid floor wax.  Do not have throw rugs and other things on the floor that can make you  trip. What can I do with my stairs?  Do not leave any items on the stairs.  Make sure that there are handrails on both sides of the stairs and use them. Fix handrails that are broken or loose. Make sure that handrails are as long as the stairways.  Check any carpeting to make sure that it is firmly attached to the stairs. Fix any carpet that is loose or worn.  Avoid having throw rugs at the top or bottom of the stairs. If you do have throw rugs, attach them to the floor with carpet tape.  Make sure that you have a light switch at the top of the stairs and the bottom of the stairs. If you do not have them, ask someone to add them for you. What else can I do to help prevent falls?  Wear shoes that:  Do not have high heels.  Have rubber bottoms.  Are comfortable and fit you well.  Are closed at the toe. Do not wear sandals.  If you use a stepladder:  Make sure that it  is fully opened. Do not climb a closed stepladder.  Make sure that both sides of the stepladder are locked into place.  Ask someone to hold it for you, if possible.  Clearly mark and make sure that you can see:  Any grab bars or handrails.  First and last steps.  Where the edge of each step is.  Use tools that help you move around (mobility aids) if they are needed. These include:  Canes.  Walkers.  Scooters.  Crutches.  Turn on the lights when you go into a dark area. Replace any light bulbs as soon as they burn out.  Set up your furniture so you have a clear path. Avoid moving your furniture around.  If any of your floors are uneven, fix them.  If there are any pets around you, be aware of where they are.  Review your medicines with your doctor. Some medicines can make you feel dizzy. This can increase your chance of falling. Ask your doctor what other things that you can do to help prevent falls. This information is not intended to replace advice given to you by your health care provider. Make sure you discuss any questions you have with your health care provider. Document Released: 03/18/2009 Document Revised: 10/28/2015 Document Reviewed: 06/26/2014 Elsevier Interactive Patient Education  2017 [employer].

## 2020-09-08 NOTE — Patient Instructions (Signed)
Diabetes Mellitus and Foot Care Foot care is an important part of your health, especially when you have diabetes. Diabetes may cause you to have problems because of poor blood flow (circulation) to your feet and legs, which can cause your skin to:  Become thinner and drier.  Break more easily.  Heal more slowly.  Peel and crack. You may also have nerve damage (neuropathy) in your legs and feet, causing decreased feeling in them. This means that you may not notice minor injuries to your feet that could lead to more serious problems. Noticing and addressing any potential problems early is the best way to prevent future foot problems. How to care for your feet Foot hygiene  Wash your feet daily with warm water and mild soap. Do not use hot water. Then, pat your feet and the areas between your toes until they are completely dry. Do not soak your feet as this can dry your skin.  Trim your toenails straight across. Do not dig under them or around the cuticle. File the edges of your nails with an emery board or nail file.  Apply a moisturizing lotion or petroleum jelly to the skin on your feet and to dry, brittle toenails. Use lotion that does not contain alcohol and is unscented. Do not apply lotion between your toes.   Shoes and socks  Wear clean socks or stockings every day. Make sure they are not too tight. Do not wear knee-high stockings since they may decrease blood flow to your legs.  Wear shoes that fit properly and have enough cushioning. Always look in your shoes before you put them on to be sure there are no objects inside.  To break in new shoes, wear them for just a few hours a day. This prevents injuries on your feet. Wounds, scrapes, corns, and calluses  Check your feet daily for blisters, cuts, bruises, sores, and redness. If you cannot see the bottom of your feet, use a mirror or ask someone for help.  Do not cut corns or calluses or try to remove them with medicine.  If you  find a minor scrape, cut, or break in the skin on your feet, keep it and the skin around it clean and dry. You may clean these areas with mild soap and water. Do not clean the area with peroxide, alcohol, or iodine.  If you have a wound, scrape, corn, or callus on your foot, look at it several times a day to make sure it is healing and not infected. Check for: ? Redness, swelling, or pain. ? Fluid or blood. ? Warmth. ? Pus or a bad smell.   General tips  Do not cross your legs. This may decrease blood flow to your feet.  Do not use heating pads or hot water bottles on your feet. They may burn your skin. If you have lost feeling in your feet or legs, you may not know this is happening until it is too late.  Protect your feet from hot and cold by wearing shoes, such as at the beach or on hot pavement.  Schedule a complete foot exam at least once a year (annually) or more often if you have foot problems. Report any cuts, sores, or bruises to your health care provider immediately. Where to find more information  American Diabetes Association: www.diabetes.org  Association of Diabetes Care & Education Specialists: www.diabeteseducator.org Contact a health care provider if:  You have a medical condition that increases your risk of infection and   you have any cuts, sores, or bruises on your feet.  You have an injury that is not healing.  You have redness on your legs or feet.  You feel burning or tingling in your legs or feet.  You have pain or cramps in your legs and feet.  Your legs or feet are numb.  Your feet always feel cold.  You have pain around any toenails. Get help right away if:  You have a wound, scrape, corn, or callus on your foot and: ? You have pain, swelling, or redness that gets worse. ? You have fluid or blood coming from the wound, scrape, corn, or callus. ? Your wound, scrape, corn, or callus feels warm to the touch. ? You have pus or a bad smell coming from  the wound, scrape, corn, or callus. ? You have a fever. ? You have a red line going up your leg. Summary  Check your feet every day for blisters, cuts, bruises, sores, and redness.  Apply a moisturizing lotion or petroleum jelly to the skin on your feet and to dry, brittle toenails.  Wear shoes that fit properly and have enough cushioning.  If you have foot problems, report any cuts, sores, or bruises to your health care provider immediately.  Schedule a complete foot exam at least once a year (annually) or more often if you have foot problems. This information is not intended to replace advice given to you by your health care provider. Make sure you discuss any questions you have with your health care provider. Document Revised: 12/11/2019 Document Reviewed: 12/11/2019 Elsevier Patient Education  2021 [employer] Inc[employer]  

## 2020-09-08 NOTE — Progress Notes (Signed)
This visit occurred during the SARS-CoV-2 public health emergency.  Safety protocols were in place, including screening questions prior to the visit, additional usage of staff PPE, and extensive cleaning of exam room while observing appropriate contact time as indicated for disinfecting solutions.  Subjective:   Christopher Harvey is a 75 y.o. male who presents for Medicare Annual/Subsequent preventive examination.  Review of Systems     Cardiac Risk Factors include: advanced age (>75men, >14 women);hypertension;male gender     Objective:    Today's Vitals   09/08/20 0901 09/08/20 0905  BP: 130/78   Pulse: 87   Temp: 97.9 F (36.6 C)   TempSrc: Oral   SpO2: 95%   Weight: 245 lb 6.4 oz (111.3 kg)   Height: 5\' 2"  (1.575 m)   PainSc:  4    Body mass index is 44.88 kg/m.  Advanced Directives 09/08/2020 03/29/2020 08/27/2019 08/15/2018  Does Patient Have a Medical Advance Directive? Yes No Yes No  Type of [employer] [place];Living will -  Copy of [place] in Chart? No - copy requested - No - copy requested -  Would patient like information on creating a medical advance directive? - - - No - Patient declined    Current Medications (verified) Outpatient Encounter Medications as of 09/08/2020  Medication Sig  . benztropine (COGENTIN) 1 MG tablet Take 1 mg by mouth 2 (two) times daily. Take 1/2 a tablet  . buPROPion (WELLBUTRIN XL) 150 MG 24 hr tablet Take by mouth.  . hydrochlorothiazide (HYDRODIURIL) 25 MG tablet Take by mouth.  . loratadine (CLARITIN) 10 MG tablet Take by mouth.  . risperiDONE (RISPERDAL) 1 MG tablet Take by mouth.  . tamsulosin (FLOMAX) 0.4 MG CAPS capsule Take 0.4 mg by mouth daily.   No facility-administered encounter medications on file as of 09/08/2020.    Allergies (verified) Patient has no known allergies.   History: Past Medical History:  Diagnosis Date  . Hypertension   .  PTSD (post-traumatic stress disorder)    Past Surgical History:  Procedure Laterality Date  . CATARACT EXTRACTION, BILATERAL Bilateral 2019   at [employer] History  Problem Relation Age of Onset  . Hypertension Mother   . Hypertension Father    Social History   Socioeconomic History  . Marital status: Married    Spouse name: Not on file  . Number of children: Not on file  . Years of education: Not on file  . Highest education level: Not on file  Occupational History  . Occupation: retired  Tobacco Use  . Smoking status: Never Smoker  . Smokeless tobacco: Never Used  Vaping Use  . Vaping Use: Never used  Substance and Sexual Activity  . Alcohol use: Yes    Comment: occasionally   . Drug use: Not Currently  . Sexual activity: Yes  Other Topics Concern  . Not on file  Social History Narrative  . Not on file   Social Determinants of Health   Financial Resource Strain: Low Risk   . Difficulty of Paying Living Expenses: Not hard at all  Food Insecurity: No Food Insecurity  . Worried About Charity fundraiser in the Last Year: Never true  . Ran Out of Food in the Last Year: Never true  Transportation Needs: No Transportation Needs  . Lack of Transportation (Medical): No  . Lack of Transportation (Non-Medical): No  Physical Activity: Sufficiently Active  . Days  of Exercise per Week: 5 days  . Minutes of Exercise per Session: 60 min  Stress: No Stress Concern Present  . Feeling of Stress : Not at all  Social Connections: Not on file    Tobacco Counseling Counseling given: Not Answered   Clinical Intake:  Pre-visit preparation completed: Yes  Pain : 0-10 Pain Score: 4  Pain Type: Chronic pain Pain Location: Hip Pain Orientation: Left Pain Descriptors / Indicators: Dull Pain Onset: More than a month ago Pain Frequency: Constant     Nutritional Status: BMI > 30  Obese Nutritional Risks: None Diabetes: No  How often do you need to have someone  help you when you read instructions, pamphlets, or other written materials from your doctor or pharmacy?: 1 - Never What is the last grade level you completed in school?: 34yrs college  Diabetic? no  Interpreter Needed?: No  Information entered by :: NAllen LPN   Activities of Daily Living In your present state of health, do you have any difficulty performing the following activities: 09/08/2020  Hearing? N  Vision? N  Difficulty concentrating or making decisions? N  Walking or climbing stairs? N  Dressing or bathing? N  Doing errands, shopping? N  Preparing Food and eating ? N  Using the Toilet? N  In the past six months, have you accidently leaked urine? N  Do you have problems with loss of bowel control? N  Managing your Medications? N  Managing your Finances? N  Housekeeping or managing your Housekeeping? N  Some recent data might be hidden    Patient Care Team: Glendale Chard, MD as PCP - General (Internal Medicine)  Indicate any recent Medical Services you may have received from other than [facility] providers in the past year (date may be approximate).     Assessment:   This is a routine wellness examination for Christopher Harvey.  Hearing/Vision screen No exam data present  Dietary issues and exercise activities discussed: Current Exercise Habits: Home exercise routine, Type of exercise: Other - see comments (stattinary bike), Time (Minutes): 60, Frequency (Times/Week): 5, Weekly Exercise (Minutes/Week): 300  Goals    .  Patient Stated (pt-stated)      To make it to the end of the year    .  Patient Stated      08/27/2019, to stay healthy    .  Patient Stated      09/08/2020, stay healthy      Depression Screen PHQ 2/9 Scores 09/08/2020 08/27/2019 02/20/2019 08/15/2018  PHQ - 2 Score 0 0 0 0  PHQ- 9 Score - 0 - 0    Fall Risk Fall Risk  09/08/2020 08/27/2019 02/20/2019 08/15/2018  Falls in the past year? 0 0 0 0  Risk for fall due to : Medication side effect Medication side  effect - Medication side effect  Follow up Falls evaluation completed;Education provided;Falls prevention discussed Education provided;Falls evaluation completed;Falls prevention discussed - Education provided;Falls prevention discussed    FALL RISK PREVENTION PERTAINING TO THE HOME:  Any stairs in or around the home? Yes  If so, are there any without handrails? No  Home free of loose throw rugs in walkways, pet beds, electrical cords, etc? Yes  Adequate lighting in your home to reduce risk of falls? Yes   ASSISTIVE DEVICES UTILIZED TO PREVENT FALLS:  Life alert? No  Use of a cane, walker or w/c? No  Grab bars in the bathroom? No  Shower chair or bench in shower? No  Elevated toilet  seat or a handicapped toilet? Yes   TIMED UP AND GO:  Was the test performed? No .    Cognitive Function:     6CIT Screen 09/08/2020 08/27/2019 08/15/2018  What Year? 0 points 0 points 0 points  What month? 0 points 0 points 0 points  What time? 0 points 0 points 0 points  Count back from 20 0 points 0 points 0 points  Months in reverse 0 points 0 points 0 points  Repeat phrase 0 points 6 points 0 points  Total Score 0 6 0    Immunizations Immunization History  Administered Date(s) Administered  . Fluad Quad(high Dose 65+) 03/03/2020  . Influenza-Unspecified 02/19/2019  . PFIZER(Purple Top)SARS-COV-2 Vaccination 07/22/2019, 08/12/2019, 03/16/2020  . Pneumococcal Conjugate-13 03/10/2015  . Pneumococcal Polysaccharide-23 10/04/2017  . Tdap 06/30/2009, 03/29/2020  . Zoster 09/08/2015  . Zoster Recombinat (Shingrix) 02/27/2019    TDAP status: Up to date  Flu Vaccine status: Up to date  Pneumococcal vaccine status: Up to date  Covid-19 vaccine status: Completed vaccines  Qualifies for Shingles Vaccine? Yes   Zostavax completed Yes   Shingrix Completed?: Yes  Screening Tests Health Maintenance  Topic Date Due  . INFLUENZA VACCINE  01/03/2021  . COLONOSCOPY (Pts 45-22yrs Insurance  coverage will need to be confirmed)  11/03/2027  . TETANUS/TDAP  08/18/2030  . COVID-19 Vaccine  Completed  . Hepatitis C Screening  Completed  . PNA vac Low Risk Adult  Completed  . HPV VACCINES  Aged Out    Health Maintenance  There are no preventive care reminders to display for this patient.  Colorectal cancer screening: Type of screening: Colonoscopy. Completed 11/02/2017. Repeat every 3 years  Lung Cancer Screening: (Low Dose CT Chest recommended if Age 58-80 years, 30 pack-year currently smoking OR have quit w/in 15years.) does not qualify.   Lung Cancer Screening Referral: no  Additional Screening:  Hepatitis C Screening: does qualify; Completed 11/26/2013  Vision Screening: Recommended annual ophthalmology exams for early detection of glaucoma and other disorders of the eye. Is the patient up to date with their annual eye exam?  Yes  Who is the provider or what is the name of the office in which the patient attends [facility] If pt is not established with a provider, would they like to be referred to a provider to establish care? No .   Dental Screening: Recommended annual dental exams for proper oral hygiene  Community Resource Referral / Chronic Care Management: CRR required this visit?  No   CCM required this visit?  No      Plan:     I have personally reviewed and noted the following in the patient's chart:   . Medical and social history . Use of alcohol, tobacco or illicit drugs  . Current medications and supplements . Functional ability and status . Nutritional status . Physical activity . Advanced directives . List of other physicians . Hospitalizations, surgeries, and ER visits in previous 12 months . Vitals . Screenings to include cognitive, depression, and falls . Referrals and appointments  In addition, I have reviewed and discussed with patient certain preventive protocols, quality metrics, and best practice recommendations. A written  personalized care plan for preventive services as well as general preventive health recommendations were provided to patient.     Kellie Simmering, LPN   07/15/5619   Nurse Notes:

## 2020-09-08 NOTE — Addendum Note (Signed)
Addended by: Glenna Durand E on: 09/08/2020 01:55 PM   Modules accepted: Orders

## 2020-09-08 NOTE — Progress Notes (Signed)
I,Katawbba Wiggins,acting as a Education administrator for [name], MD.,have documented all relevant documentation on the behalf of [name], MD,as directed by  [name], MD while in the presence of [name], MD.  This visit occurred during the SARS-CoV-2 public health emergency.  Safety protocols were in place, including screening questions prior to the visit, additional usage of staff PPE, and extensive cleaning of exam room while observing appropriate contact time as indicated for disinfecting solutions.  Subjective:     Patient ID: Christopher Harvey , male    DOB: 1946/05/10 , 75 y.o.   MRN: [id number]   Chief Complaint  Patient presents with  . Hypertension    HPI  Patient is here for htn follow up. He reports compliance with meds. He is also followed by [name]. He states he has upcoming evaluation for colonoscopy. He was last seen on 3/1 at the [name]. He is also scheduled for AWV today.   Hypertension This is a chronic problem. The current episode started more than 1 year ago. The problem has been gradually improving since onset. The problem is uncontrolled. Pertinent negatives include no blurred vision, chest pain, palpitations or shortness of breath. Past treatments include diuretics. The current treatment provides moderate improvement. Compliance problems include exercise.  Hypertensive end-organ damage includes kidney disease.     Past Medical History:  Diagnosis Date  . Hypertension   . PTSD (post-traumatic stress disorder)      Family History  Problem Relation Age of Onset  . Hypertension Mother   . Hypertension Father      Current Outpatient Medications:  .  benztropine (COGENTIN) 1 MG tablet, Take 1 mg by mouth 2 (two) times daily. Take 1/2 a tablet, Disp: , Rfl:  .  buPROPion (WELLBUTRIN XL) 150 MG 24 hr tablet, Take by mouth., Disp: , Rfl:  .  hydrochlorothiazide (HYDRODIURIL) 25 MG tablet, Take by mouth., Disp: , Rfl:  .  loratadine (CLARITIN)  10 MG tablet, Take by mouth., Disp: , Rfl:  .  risperiDONE (RISPERDAL) 1 MG tablet, Take by mouth., Disp: , Rfl:  .  tamsulosin (FLOMAX) 0.4 MG CAPS capsule, Take 0.4 mg by mouth daily., Disp: , Rfl:    No Known Allergies   Review of Systems  Constitutional: Negative.   Eyes: Negative for blurred vision.  Respiratory: Negative.  Negative for shortness of breath.   Cardiovascular: Negative.  Negative for chest pain and palpitations.  Gastrointestinal: Negative.   Neurological: Negative.   Psychiatric/Behavioral: Negative.      Today's Vitals   09/08/20 0917  BP: 130/78  Pulse: 87  Temp: 97.9 F (36.6 C)  TempSrc: Oral  Weight: 245 lb 6.4 oz (111.3 kg)  Height: 5' 8.6" (1.742 m)   Body mass index is 36.66 kg/m.  Wt Readings from Last 3 Encounters:  09/08/20 245 lb 6.4 oz (111.3 kg)  09/08/20 245 lb 6.4 oz (111.3 kg)  06/07/20 243 lb (110.2 kg)   Objective:  Physical Exam Vitals and nursing note reviewed.  Constitutional:      Appearance: Normal appearance. He is obese.  HENT:     Head: Normocephalic and atraumatic.     Nose:     Comments: Masked     Mouth/Throat:     Comments: Masked  Cardiovascular:     Rate and Rhythm: Normal rate and regular rhythm.     Heart sounds: Normal heart sounds.  Pulmonary:     Effort: Pulmonary effort is normal.  Breath sounds: Normal breath sounds.  Musculoskeletal:     Cervical back: Normal range of motion.  Skin:    General: Skin is warm.  Neurological:     General: No focal deficit present.     Mental Status: He is alert.  Psychiatric:        Mood and Affect: Mood normal.         Assessment And Plan:     1. Parenchymal renal hypertension, stage 1 through stage 4 or unspecified chronic kidney disease Comments: Chronic, controlled. He will c/w current meds. Encouraged to limit his sodium intake. Importance of regular exercise was also discussed w/ pt. F/u 6 months.   2. Chronic renal disease, stage II Comments:  Chronic, this has been stable. He has [name] available, will have labs abstracted. GFR is stable. Encouraged to stay well hydrated.   3. Other abnormal glucose Comments: His glucose has been elevated in the past. [facility] hba1c 5.1. He is encouraged to limit his intake of sweetened beverages.  4. Class 2 severe obesity due to excess calories with serious comorbidity and body mass index (BMI) of 36.0 to 36.9 in adult [facility])  He is encouraged to initially strive for BMI less than 30 to decrease cardiac risk. He is advised to exercise no less than 150 minutes per week.    Patient was given opportunity to ask questions. Patient verbalized understanding of the plan and was able to repeat key elements of the plan. All questions were answered to their satisfaction.     I, Glendale Chard, MD, have reviewed all documentation for this visit. The documentation on 09/10/20 for the exam, diagnosis, procedures, and orders are all accurate and complete.   IF YOU HAVE BEEN REFERRED TO A SPECIALIST, IT MAY TAKE 1-2 WEEKS TO SCHEDULE/PROCESS THE REFERRAL. IF YOU HAVE NOT HEARD FROM US/SPECIALIST IN TWO WEEKS, PLEASE GIVE [place] A CALL AT [phone].   THE PATIENT IS ENCOURAGED TO PRACTICE SOCIAL DISTANCING DUE TO THE COVID-19 PANDEMIC.

## 2020-09-10 ENCOUNTER — Encounter: Payer: Self-pay | Admitting: Internal Medicine

## 2020-10-14 LAB — HM COLONOSCOPY

## 2020-10-28 DIAGNOSIS — U071 COVID-19: Secondary | ICD-10-CM | POA: Diagnosis not present

## 2020-11-08 ENCOUNTER — Encounter: Payer: Self-pay | Admitting: Internal Medicine

## 2020-12-10 DIAGNOSIS — R350 Frequency of micturition: Secondary | ICD-10-CM | POA: Diagnosis not present

## 2020-12-31 ENCOUNTER — Encounter: Payer: Self-pay | Admitting: Internal Medicine

## 2021-02-04 ENCOUNTER — Encounter: Payer: Self-pay | Admitting: Internal Medicine

## 2021-03-14 ENCOUNTER — Other Ambulatory Visit: Payer: Self-pay

## 2021-03-14 ENCOUNTER — Ambulatory Visit (INDEPENDENT_AMBULATORY_CARE_PROVIDER_SITE_OTHER): Payer: Medicare Other | Admitting: Internal Medicine

## 2021-03-14 ENCOUNTER — Encounter: Payer: Self-pay | Admitting: Internal Medicine

## 2021-03-14 VITALS — BP 136/74 | HR 75 | Temp 98.3°F | Ht 68.8 in | Wt 235.6 lb

## 2021-03-14 DIAGNOSIS — I129 Hypertensive chronic kidney disease with stage 1 through stage 4 chronic kidney disease, or unspecified chronic kidney disease: Secondary | ICD-10-CM

## 2021-03-14 DIAGNOSIS — M4722 Other spondylosis with radiculopathy, cervical region: Secondary | ICD-10-CM | POA: Diagnosis not present

## 2021-03-14 DIAGNOSIS — Z0000 Encounter for general adult medical examination without abnormal findings: Secondary | ICD-10-CM | POA: Diagnosis not present

## 2021-03-14 DIAGNOSIS — Z23 Encounter for immunization: Secondary | ICD-10-CM

## 2021-03-14 DIAGNOSIS — N401 Enlarged prostate with lower urinary tract symptoms: Secondary | ICD-10-CM

## 2021-03-14 DIAGNOSIS — E6609 Other obesity due to excess calories: Secondary | ICD-10-CM

## 2021-03-14 DIAGNOSIS — N182 Chronic kidney disease, stage 2 (mild): Secondary | ICD-10-CM

## 2021-03-14 DIAGNOSIS — R972 Elevated prostate specific antigen [PSA]: Secondary | ICD-10-CM | POA: Insufficient documentation

## 2021-03-14 DIAGNOSIS — Z6834 Body mass index (BMI) 34.0-34.9, adult: Secondary | ICD-10-CM

## 2021-03-14 DIAGNOSIS — R7309 Other abnormal glucose: Secondary | ICD-10-CM | POA: Diagnosis not present

## 2021-03-14 DIAGNOSIS — N3943 Post-void dribbling: Secondary | ICD-10-CM

## 2021-03-14 MED ORDER — TAMSULOSIN HCL 0.4 MG PO CAPS
0.4000 mg | ORAL_CAPSULE | Freq: Every day | ORAL | 2 refills | Status: DC
Start: 2021-03-14 — End: 2024-01-09

## 2021-03-14 NOTE — Progress Notes (Signed)
I,Tianna Badgett,acting as a Education administrator for [name], MD.,have documented all relevant documentation on the behalf of [name], MD,as directed by  [name], MD while in the presence of [name], MD.  This visit occurred during the SARS-CoV-2 public health emergency.  Safety protocols were in place, including screening questions prior to the visit, additional usage of staff PPE, and extensive cleaning of exam room while observing appropriate contact time as indicated for disinfecting solutions.  Subjective:     Patient ID: Christopher Harvey , male    DOB: 1945-09-22 , 75 y.o.   MRN: [id number]   Chief Complaint  Patient presents with   Annual Exam   Hypertension    HPI  He is here today for a full physical examination. He is also followed at the [name]. He reports compliance with meds. He denies having any headaches, chest pain and shortness of breath.   Hypertension This is a chronic problem. The current episode started more than 1 year ago. The problem has been gradually improving since onset. The problem is controlled. Pertinent negatives include no blurred vision, chest pain, palpitations or shortness of breath. Risk factors for coronary artery disease include obesity and male gender. The current treatment provides moderate improvement. Compliance problems include exercise.     Past Medical History:  Diagnosis Date   Hypertension    PTSD (post-traumatic stress disorder)      Family History  Problem Relation Age of Onset   Hypertension Mother    Hypertension Father      Current Outpatient Medications:    benztropine (COGENTIN) 1 MG tablet, Take 1 mg by mouth 2 (two) times daily. Take 1/2 a tablet, Disp: , Rfl:    buPROPion (WELLBUTRIN XL) 150 MG 24 hr tablet, Take by mouth., Disp: , Rfl:    hydrochlorothiazide (HYDRODIURIL) 25 MG tablet, Take by mouth., Disp: , Rfl:    loratadine (CLARITIN) 10 MG tablet, Take by mouth., Disp: , Rfl:    risperiDONE  (RISPERDAL) 1 MG tablet, Take by mouth., Disp: , Rfl:    tamsulosin (FLOMAX) 0.4 MG CAPS capsule, Take 1 capsule (0.4 mg total) by mouth daily., Disp: 90 capsule, Rfl: 2   No Known Allergies   Men's preventive visit. Patient Health Questionnaire (PHQ-2) is  Flowsheet Row Clinical Support from 09/08/2020 in [facility]  PHQ-2 Total Score 0     . Patient is on a low salt diet. Marital status: Married. Relevant history for alcohol use is:  Social History   Substance and Sexual Activity  Alcohol Use Yes   Comment: occasionally   . Relevant history for tobacco use is:  Social History   Tobacco Use  Smoking Status Never  Smokeless Tobacco Never  .   Review of Systems  Constitutional: Negative.   HENT: Negative.    Eyes: Negative.  Negative for blurred vision.  Respiratory: Negative.  Negative for shortness of breath.   Cardiovascular: Negative.  Negative for chest pain and palpitations.  Gastrointestinal: Negative.   Endocrine: Negative.   Genitourinary: Negative.        He has run out of Flomax. He has noticed increased frequency.   Musculoskeletal: Negative.   Skin: Negative.   Allergic/Immunologic: Negative.   Neurological: Negative.  Negative for syncope.  Hematological: Negative.   Psychiatric/Behavioral: Negative.      Today's Vitals   03/14/21 1104  BP: 136/74  Pulse: 75  Temp: 98.3 F (36.8 C)  TempSrc: Oral  Weight:  235 lb 9.6 oz (106.9 kg)  Height: 5' 8.8" (1.748 m)   Body mass index is 34.99 kg/m.  Wt Readings from Last 3 Encounters:  03/14/21 235 lb 9.6 oz (106.9 kg)  09/08/20 245 lb 6.4 oz (111.3 kg)  09/08/20 245 lb 6.4 oz (111.3 kg)    Objective:  Physical Exam Vitals and nursing note reviewed.  Constitutional:      Appearance: Normal appearance.  HENT:     Head: Normocephalic and atraumatic.     Right Ear: Tympanic membrane, ear canal and external ear normal.     Left Ear: Tympanic membrane, ear canal and external ear  normal.     Nose:     Comments: Masked     Mouth/Throat:     Comments: Masked  Eyes:     Extraocular Movements: Extraocular movements intact.     Conjunctiva/sclera: Conjunctivae normal.     Pupils: Pupils are equal, round, and reactive to light.  Cardiovascular:     Rate and Rhythm: Normal rate and regular rhythm.     Pulses: Normal pulses.     Heart sounds: Normal heart sounds.  Pulmonary:     Effort: Pulmonary effort is normal.     Breath sounds: Normal breath sounds.  Chest:  Breasts:    Right: Normal. No swelling, bleeding, inverted nipple, mass or nipple discharge.     Left: Normal. No swelling, bleeding, inverted nipple, mass or nipple discharge.  Abdominal:     General: Abdomen is flat. Bowel sounds are normal.     Palpations: Abdomen is soft.  Genitourinary:    Comments: Deferred  Musculoskeletal:        General: Normal range of motion.     Cervical back: Normal range of motion and neck supple.  Skin:    General: Skin is warm.  Neurological:     General: No focal deficit present.     Mental Status: He is alert.  Psychiatric:        Mood and Affect: Mood normal.        Behavior: Behavior normal.        Assessment And Plan:    1. Routine general medical examination at health care facility Comments: A full exam was performed, DRE deferred as per patient. He plans to f/u with Urology in the near future. PATIENT IS ADVISED TO GET 30-45 MINUTES REGULAR EXERCISE NO LESS THAN FOUR TO FIVE DAYS PER WEEK - BOTH WEIGHTBEARING EXERCISES AND AEROBIC ARE RECOMMENDED.  PATIENT IS ADVISED TO FOLLOW A HEALTHY DIET WITH AT LEAST SIX FRUITS/VEGGIES PER DAY, DECREASE INTAKE OF RED MEAT, AND TO INCREASE FISH INTAKE TO TWO DAYS PER WEEK.  MEATS/FISH SHOULD NOT BE FRIED, BAKED OR BROILED IS PREFERABLE.  IT IS ALSO IMPORTANT TO CUT BACK ON YOUR SUGAR INTAKE. PLEASE AVOID ANYTHING WITH ADDED SUGAR, CORN SYRUP OR OTHER SWEETENERS. IF YOU MUST USE A SWEETENER, YOU CAN TRY STEVIA. IT IS ALSO  IMPORTANT TO AVOID ARTIFICIALLY SWEETENERS AND DIET BEVERAGES. LASTLY, I SUGGEST WEARING SPF 50 SUNSCREEN ON EXPOSED PARTS AND ESPECIALLY WHEN IN THE DIRECT SUNLIGHT FOR AN EXTENDED PERIOD OF TIME.  PLEASE AVOID FAST FOOD RESTAURANTS AND INCREASE YOUR WATER INTAKE.   2. Parenchymal renal hypertension, stage 1 through stage 4 or unspecified chronic kidney disease Comments: Chronic, fair control. EKG performed, NSR w/ nonspecific T abnormality.  He is encouraged to follow low sodium diet. He will f/u in six months for re-evaluation.  - EKG 12-Lead - CBC - CMP14+EGFR -  Lipid panel  3. Chronic renal disease, stage II Comments: Chronic, encouraged to stay well hydrated and keep BP under goal (130/80) to decrease risk of progression of CKD.   4. Cervical spondylosis with radiculopathy Comments: Chronic, I reviewed MRI C-spine results with pt during the visit. He plans to f/u with [facility] provider.   5. Benign prostatic hyperplasia with post-void dribbling Comments: Chronic, previous Urology notes reviewed in full detail. He was given refill of Flomax to take once daily. Encouraged to schedule Urology f/u within next 3-6 months.   6. Other abnormal glucose Comments: His a1c has been elevated in the past. I will recheck this today. Advised to decrease intake of sweetened beverages, including diet drinks.  - Hemoglobin A1c - CMP14+EGFR - Insulin, random(561)  7. Class 1 obesity due to excess calories with serious comorbidity and body mass index (BMI) of 34.0 to 34.9 in adult Comments: He is encouraged to initially strive for BMI less than 30 to decrease cardiac risk. He is advised to exercise no less than 150 minutes per week.   8. Need for influenza vaccination Comments: He was given high dose flu vaccine.  - Flu Vaccine QUAD High Dose(Fluad)  Patient was given opportunity to ask questions. Patient verbalized understanding of the plan and was able to repeat key elements of the plan. All questions  were answered to their satisfaction.   I, [name], MD, have reviewed all documentation for this visit. The documentation on 03/15/21 for the exam, diagnosis, procedures, and orders are all accurate and complete.   THE PATIENT IS ENCOURAGED TO PRACTICE SOCIAL DISTANCING DUE TO THE COVID-19 PANDEMIC.

## 2021-03-14 NOTE — Patient Instructions (Signed)
Health Maintenance, Male Adopting a healthy lifestyle and getting preventive care are important in promoting health and wellness. Ask your health care provider about: The right schedule for you to have regular tests and exams. Things you can do on your own to prevent diseases and keep yourself healthy. What should I know about diet, weight, and exercise? Eat a healthy diet  Eat a diet that includes plenty of vegetables, fruits, low-fat dairy products, and lean protein. Do not eat a lot of foods that are high in solid fats, added sugars, or sodium. Maintain a healthy weight Body mass index (BMI) is a measurement that can be used to identify possible weight problems. It estimates body fat based on height and weight. Your health care provider can help determine your BMI and help you achieve or maintain a healthy weight. Get regular exercise Get regular exercise. This is one of the most important things you can do for your health. Most adults should: Exercise for at least 150 minutes each week. The exercise should increase your heart rate and make you sweat (moderate-intensity exercise). Do strengthening exercises at least twice a week. This is in addition to the moderate-intensity exercise. Spend less time sitting. Even light physical activity can be beneficial. Watch cholesterol and blood lipids Have your blood tested for lipids and cholesterol at 74 years of age, then have this test every 5 years. You may need to have your cholesterol levels checked more often if: Your lipid or cholesterol levels are high. You are older than 74 years of age. You are at high risk for heart disease. What should I know about cancer screening? Many types of cancers can be detected early and may often be prevented. Depending on your health history and family history, you may need to have cancer screening at various ages. This may include screening for: Colorectal cancer. Prostate cancer. Skin cancer. Lung  cancer. What should I know about heart disease, diabetes, and high blood pressure? Blood pressure and heart disease High blood pressure causes heart disease and increases the risk of stroke. This is more likely to develop in people who have high blood pressure readings, are of African descent, or are overweight. Talk with your health care provider about your target blood pressure readings. Have your blood pressure checked: Every 3-5 years if you are 18-39 years of age. Every year if you are 40 years old or older. If you are between the ages of 65 and 75 and are a current or former smoker, ask your health care provider if you should have a one-time screening for abdominal aortic aneurysm (AAA). Diabetes Have regular diabetes screenings. This checks your fasting blood sugar level. Have the screening done: Once every three years after age 45 if you are at a normal weight and have a low risk for diabetes. More often and at a younger age if you are overweight or have a high risk for diabetes. What should I know about preventing infection? Hepatitis B If you have a higher risk for hepatitis B, you should be screened for this virus. Talk with your health care provider to find out if you are at risk for hepatitis B infection. Hepatitis C Blood testing is recommended for: Everyone born from 1945 through 1965. Anyone with known risk factors for hepatitis C. Sexually transmitted infections (STIs) You should be screened each year for STIs, including gonorrhea and chlamydia, if: You are sexually active and are younger than 74 years of age. You are older than 74 years   of age and your health care provider tells you that you are at risk for this type of infection. Your sexual activity has changed since you were last screened, and you are at increased risk for chlamydia or gonorrhea. Ask your health care provider if you are at risk. Ask your health care provider about whether you are at high risk for HIV.  Your health care provider may recommend a prescription medicine to help prevent HIV infection. If you choose to take medicine to prevent HIV, you should first get tested for HIV. You should then be tested every 3 months for as long as you are taking the medicine. Follow these instructions at home: Lifestyle Do not use any products that contain nicotine or tobacco, such as cigarettes, e-cigarettes, and chewing tobacco. If you need help quitting, ask your health care provider. Do not use street drugs. Do not share needles. Ask your health care provider for help if you need support or information about quitting drugs. Alcohol use Do not drink alcohol if your health care provider tells you not to drink. If you drink alcohol: Limit how much you have to 0-2 drinks a day. Be aware of how much alcohol is in your drink. In the [place] one drink equals one 12 oz bottle of beer (355 mL), one 5 oz glass of wine (148 mL), or one 1 oz glass of hard liquor (44 mL). General instructions Schedule regular health, dental, and eye exams. Stay current with your vaccines. Tell your health care provider if: You often feel depressed. You have ever been abused or do not feel safe at home. Summary Adopting a healthy lifestyle and getting preventive care are important in promoting health and wellness. Follow your health care provider's instructions about healthy diet, exercising, and getting tested or screened for diseases. Follow your health care provider's instructions on monitoring your cholesterol and blood pressure. This information is not intended to replace advice given to you by your health care provider. Make sure you discuss any questions you have with your health care provider. Document Revised: 07/30/2020 Document Reviewed: 05/15/2018 Elsevier Patient Education  2022 Elsevier Inc.  

## 2021-03-15 LAB — CBC
Hematocrit: 44.4 % (ref 37.5–51.0)
Hemoglobin: 14.8 g/dL (ref 13.0–17.7)
MCH: 31.8 pg (ref 26.6–33.0)
MCHC: 33.3 g/dL (ref 31.5–35.7)
MCV: 95 fL (ref 79–97)
Platelets: 197 10*3/uL (ref 150–450)
RBC: 4.66 x10E6/uL (ref 4.14–5.80)
RDW: 11.4 % — ABNORMAL LOW (ref 11.6–15.4)
WBC: 6.1 10*3/uL (ref 3.4–10.8)

## 2021-03-15 LAB — CMP14+EGFR
ALT: 25 IU/L (ref 0–44)
AST: 15 IU/L (ref 0–40)
Albumin/Globulin Ratio: 1.8 (ref 1.2–2.2)
Albumin: 4.6 g/dL (ref 3.7–4.7)
Alkaline Phosphatase: 78 IU/L (ref 44–121)
BUN/Creatinine Ratio: 13 (ref 10–24)
BUN: 16 mg/dL (ref 8–27)
Bilirubin Total: 0.4 mg/dL (ref 0.0–1.2)
CO2: 27 mmol/L (ref 20–29)
Calcium: 9.5 mg/dL (ref 8.6–10.2)
Chloride: 103 mmol/L (ref 96–106)
Creatinine, Ser: 1.21 mg/dL (ref 0.76–1.27)
Globulin, Total: 2.5 g/dL (ref 1.5–4.5)
Glucose: 100 mg/dL — ABNORMAL HIGH (ref 70–99)
Potassium: 4.9 mmol/L (ref 3.5–5.2)
Sodium: 142 mmol/L (ref 134–144)
Total Protein: 7.1 g/dL (ref 6.0–8.5)
eGFR: 63 mL/min/{1.73_m2} (ref >59)

## 2021-03-15 LAB — LIPID PANEL
Chol/HDL Ratio: 4.1 ratio (ref 0.0–5.0)
Cholesterol, Total: 167 mg/dL (ref 100–199)
HDL: 41 mg/dL (ref >39)
LDL Chol Calc (NIH): 111 mg/dL — ABNORMAL HIGH (ref 0–99)
Triglycerides: 82 mg/dL (ref 0–149)
VLDL Cholesterol Cal: 15 mg/dL (ref 5–40)

## 2021-03-15 LAB — HEMOGLOBIN A1C
Est. average glucose Bld gHb Est-mCnc: 103 mg/dL
Hgb A1c MFr Bld: 5.2 % (ref 4.8–5.6)

## 2021-03-15 LAB — INSULIN, RANDOM: INSULIN: 38.9 u[IU]/mL — ABNORMAL HIGH (ref 2.6–24.9)

## 2021-04-01 ENCOUNTER — Encounter: Payer: Self-pay | Admitting: Internal Medicine

## 2021-06-27 ENCOUNTER — Encounter: Payer: Self-pay | Admitting: Internal Medicine

## 2021-07-07 LAB — HM HEPATITIS C SCREENING LAB: HM Hepatitis Screen: NEGATIVE

## 2021-07-12 ENCOUNTER — Encounter: Payer: Self-pay | Admitting: Internal Medicine

## 2021-08-04 LAB — BASIC METABOLIC PANEL
BUN: 12 (ref 4–21)
CO2: 31 — AB (ref 13–22)
Chloride: 102 (ref 99–108)
Creatinine: 1.1 (ref 0.6–1.3)
Glucose: 115
Potassium: 4.2 mEq/L (ref 3.5–5.1)
Sodium: 140 (ref 137–147)

## 2021-08-04 LAB — CBC: RBC: 4.8 (ref 3.87–5.11)

## 2021-08-04 LAB — CBC AND DIFFERENTIAL
HCT: 46 (ref 41–53)
Hemoglobin: 15.2 (ref 13.5–17.5)
Platelets: 184 10*3/uL (ref 150–400)
WBC: 6

## 2021-08-04 LAB — COMPREHENSIVE METABOLIC PANEL
Albumin: 4.5 (ref 3.5–5.0)
Calcium: 9.9 (ref 8.7–10.7)

## 2021-08-04 LAB — LIPID PANEL
Cholesterol: 201 — AB (ref 0–200)
HDL: 34 — AB (ref 35–70)
LDL Cholesterol: 144
Triglycerides: 113 (ref 40–160)

## 2021-08-04 LAB — HEPATIC FUNCTION PANEL
ALT: 28 U/L (ref 10–40)
AST: 19 (ref 14–40)
Alkaline Phosphatase: 62 (ref 25–125)
Bilirubin, Direct: 0.3 (ref 0.01–0.4)
Bilirubin, Total: 0.7

## 2021-08-04 LAB — VITAMIN D 25 HYDROXY (VIT D DEFICIENCY, FRACTURES): Vit D, 25-Hydroxy: 34.4

## 2021-08-04 LAB — VITAMIN B12: Vitamin B-12: 524

## 2021-08-24 DIAGNOSIS — R350 Frequency of micturition: Secondary | ICD-10-CM | POA: Diagnosis not present

## 2021-09-07 ENCOUNTER — Ambulatory Visit: Payer: Medicare Other

## 2021-09-12 ENCOUNTER — Telehealth: Payer: Self-pay

## 2021-09-12 NOTE — Telephone Encounter (Signed)
The pt called to schedule an appt for evaluation of sciatic nerve pain and the pt was told that he has an appt on the 12th, the pt said that he will wait until his appt date.  ?

## 2021-09-14 ENCOUNTER — Ambulatory Visit: Payer: Medicare Other

## 2021-09-14 ENCOUNTER — Ambulatory Visit (INDEPENDENT_AMBULATORY_CARE_PROVIDER_SITE_OTHER): Payer: Medicare Other | Admitting: Internal Medicine

## 2021-09-14 ENCOUNTER — Encounter: Payer: Self-pay | Admitting: Internal Medicine

## 2021-09-14 ENCOUNTER — Ambulatory Visit: Payer: Medicare Other | Admitting: Internal Medicine

## 2021-09-14 VITALS — BP 124/70 | HR 81 | Temp 98.1°F | Ht 68.8 in | Wt 249.8 lb

## 2021-09-14 DIAGNOSIS — M5416 Radiculopathy, lumbar region: Secondary | ICD-10-CM | POA: Diagnosis not present

## 2021-09-14 DIAGNOSIS — I129 Hypertensive chronic kidney disease with stage 1 through stage 4 chronic kidney disease, or unspecified chronic kidney disease: Secondary | ICD-10-CM

## 2021-09-14 DIAGNOSIS — G8929 Other chronic pain: Secondary | ICD-10-CM

## 2021-09-14 DIAGNOSIS — M5442 Lumbago with sciatica, left side: Secondary | ICD-10-CM

## 2021-09-14 DIAGNOSIS — N182 Chronic kidney disease, stage 2 (mild): Secondary | ICD-10-CM | POA: Diagnosis not present

## 2021-09-14 DIAGNOSIS — Z6837 Body mass index (BMI) 37.0-37.9, adult: Secondary | ICD-10-CM

## 2021-09-14 MED ORDER — TRIAMCINOLONE ACETONIDE 40 MG/ML IJ SUSP
40.0000 mg | Freq: Once | INTRAMUSCULAR | Status: AC
  Administered 2021-09-14: 40 mg via INTRAMUSCULAR

## 2021-09-14 NOTE — Patient Instructions (Signed)

## 2021-09-14 NOTE — Progress Notes (Signed)
?Rich Brave [place]ittleton,acting as a Education administrator for [name], MD.,have documented all relevant documentation on the behalf of [name], MD,as directed by  [name], MD while in the presence of [name], MD.  ?This visit occurred during the SARS-CoV-2 public health emergency.  Safety protocols were in place, including screening questions prior to the visit, additional usage of staff PPE, and extensive cleaning of exam room while observing appropriate contact time as indicated for disinfecting solutions. ? ?Subjective:  ?  ? Patient ID: Christopher Harvey , male    DOB: [birth year] , 76 y.o.   MRN: [id number] ? ? ?Chief Complaint  ?Patient presents with  ? Hypertension  ? Back Pain  ? ? ?HPI ? ?Patient is here for HTN follow up. He reports compliance with meds. He is also followed by [facility]. Patient also complains of back pain. He reports he is taking naproxen for it and it is helping some. He is requesting to have an MRI & physical therapy.  ? ?Hypertension ?This is a chronic problem. The current episode started more than 1 year ago. The problem has been gradually improving since onset. The problem is uncontrolled. Pertinent negatives include no blurred vision, chest pain, palpitations or shortness of breath. Past treatments include diuretics. The current treatment provides moderate improvement. Compliance problems include exercise.  Hypertensive end-organ damage includes kidney disease.  ?Back Pain ?Pertinent negatives include no chest pain.   ? ?Past Medical History:  ?Diagnosis Date  ? Hypertension   ? PTSD (post-traumatic stress disorder)   ?  ? ?Family History  ?Problem Relation Age of Onset  ? Hypertension Mother   ? Hypertension Father   ? ? ? ?Current Outpatient Medications:  ?  benztropine (COGENTIN) 1 MG tablet, Take 1 mg by mouth 2 (two) times daily. Take 1/2 a tablet, Disp: , Rfl:  ?  buPROPion (WELLBUTRIN XL) 150 MG 24 hr tablet, Take by mouth., Disp: , Rfl:  ?   hydrochlorothiazide (HYDRODIURIL) 25 MG tablet, Take by mouth., Disp: , Rfl:  ?  loratadine (CLARITIN) 10 MG tablet, Take by mouth., Disp: , Rfl:  ?  naproxen (NAPROSYN) 500 MG tablet, TAKE ONE TABLET BY MOUTH TWICE A DAY TAKE WITH FOOD, Disp: , Rfl:  ?  risperiDONE (RISPERDAL) 1 MG tablet, Take by mouth., Disp: , Rfl:  ?  tamsulosin (FLOMAX) 0.4 MG CAPS capsule, Take 1 capsule (0.4 mg total) by mouth daily., Disp: 90 capsule, Rfl: 2  ? ?No Known Allergies  ? ?Review of Systems  ?Constitutional: Negative.   ?Eyes:  Negative for blurred vision.  ?Respiratory: Negative.  Negative for shortness of breath.   ?Cardiovascular: Negative.  Negative for chest pain and palpitations.  ?Gastrointestinal: Negative.   ?Musculoskeletal:  Positive for back pain.  ?Neurological: Negative.   ?Psychiatric/Behavioral: Negative.     ? ?Today's Vitals  ? 09/14/21 1143  ?BP: 124/70  ?Pulse: 81  ?Temp: 98.1 ?F (36.7 ?C)  ?Weight: 249 lb 12.8 oz (113.3 kg)  ?Height: 5' 8.8" (1.748 m)  ?PainSc: 6   ?PainLoc: Back  ? ?Body mass index is 37.1 kg/m?.  ?Wt Readings from Last 3 Encounters:  ?09/21/21 249 lb (112.9 kg)  ?09/14/21 249 lb 12.8 oz (113.3 kg)  ?03/14/21 235 lb 9.6 oz (106.9 kg)  ?  ? ?Objective:  ?Physical Exam ?Vitals and nursing note reviewed.  ?Constitutional:   ?   Appearance: Normal appearance.  ?Eyes:  ?   Extraocular Movements: Extraocular movements intact.  ?  Cardiovascular:  ?   Rate and Rhythm: Normal rate and regular rhythm.  ?   Heart sounds: Normal heart sounds.  ?Pulmonary:  ?   Effort: Pulmonary effort is normal.  ?   Breath sounds: Normal breath sounds.  ?Musculoskeletal:     ?   General: Tenderness present.  ?   Cervical back: Normal range of motion.  ?   Comments: Left gluteal tenderness ?Pos straight leg test on left  ?Skin: ?   General: Skin is warm.  ?Neurological:  ?   General: No focal deficit present.  ?   Mental Status: He is alert.  ?Psychiatric:     ?   Mood and Affect: Mood normal.  ?  ?   ?Assessment And  Plan:  ?   ?1. Parenchymal renal hypertension, stage 1 through stage 4 or unspecified chronic kidney disease ?Comments: Chronic, well controlled.  He will c/w HCTZ as per [name]. I would suggest change in therapy if CKD progresses. Reviewed labs from [name], will abstract.  ? ?2. Chronic renal disease, stage II ?Comments: Chronic, he is encouraged to stay well hydrated and avoid NSAIDs.  ? ?3. Chronic left-sided low back pain with left-sided sciatica ?Comments: I will refer him for MRI lumbar spine. He is also followed by the [name], yet they did not order the study.  ?- MR Lumbar Spine Wo Contrast; Future ?- triamcinolone acetonide (KENALOG-40) injection 40 mg ? ?4. Lumbar radiculopathy ?Comments: I will schedule him for MRI lumbar spine. He was also given Kenalog, '40mg'$  IM x 1 and given several stretching exercises to perform daily.  ?- MR Lumbar Spine Wo Contrast; Future ?- triamcinolone acetonide (KENALOG-40) injection 40 mg ? ?5. Class 2 severe obesity due to excess calories with serious comorbidity and body mass index (BMI) of 37.0 to 37.9 in adult [facility]) ?Comments: He is encouraged to aim for at least 150 minutes of exercise per week, while striving for BMI<30 to decrease cardiac risk.  ?  ?Patient was given opportunity to ask questions. Patient verbalized understanding of the plan and was able to repeat key elements of the plan. All questions were answered to their satisfaction.  ? ?I, [name], MD, have reviewed all documentation for this visit. The documentation on 09/24/21 for the exam, diagnosis, procedures, and orders are all accurate and complete.  ? ?IF YOU HAVE BEEN REFERRED TO A SPECIALIST, IT MAY TAKE 1-2 WEEKS TO SCHEDULE/PROCESS THE REFERRAL. IF YOU HAVE NOT HEARD FROM US/SPECIALIST IN TWO WEEKS, PLEASE GIVE [place] A CALL AT [phone] X [phone].  ? ?THE PATIENT IS ENCOURAGED TO PRACTICE SOCIAL DISTANCING DUE TO THE COVID-19 PANDEMIC.   ?

## 2021-09-15 ENCOUNTER — Telehealth (HOSPITAL_BASED_OUTPATIENT_CLINIC_OR_DEPARTMENT_OTHER): Payer: Self-pay

## 2021-09-21 ENCOUNTER — Ambulatory Visit (INDEPENDENT_AMBULATORY_CARE_PROVIDER_SITE_OTHER): Payer: Medicare Other

## 2021-09-21 ENCOUNTER — Ambulatory Visit (HOSPITAL_BASED_OUTPATIENT_CLINIC_OR_DEPARTMENT_OTHER)
Admission: RE | Admit: 2021-09-21 | Discharge: 2021-09-21 | Disposition: A | Discharge status: Home / Self Care | Payer: Medicare Other | Source: Ambulatory Visit | Attending: Internal Medicine | Admitting: Internal Medicine

## 2021-09-21 VITALS — Ht 71.0 in | Wt 249.0 lb

## 2021-09-21 DIAGNOSIS — M5442 Lumbago with sciatica, left side: Secondary | ICD-10-CM | POA: Insufficient documentation

## 2021-09-21 DIAGNOSIS — G8929 Other chronic pain: Secondary | ICD-10-CM | POA: Insufficient documentation

## 2021-09-21 DIAGNOSIS — M5416 Radiculopathy, lumbar region: Secondary | ICD-10-CM | POA: Diagnosis not present

## 2021-09-21 DIAGNOSIS — M5116 Intervertebral disc disorders with radiculopathy, lumbar region: Secondary | ICD-10-CM | POA: Diagnosis not present

## 2021-09-21 DIAGNOSIS — Z0000 Encounter for general adult medical examination without abnormal findings: Secondary | ICD-10-CM

## 2021-09-21 DIAGNOSIS — M48061 Spinal stenosis, lumbar region without neurogenic claudication: Secondary | ICD-10-CM | POA: Diagnosis not present

## 2021-09-21 DIAGNOSIS — M4316 Spondylolisthesis, lumbar region: Secondary | ICD-10-CM | POA: Diagnosis not present

## 2021-09-21 NOTE — Progress Notes (Signed)
?I connected with Wilber Bihari today by telephone and verified that I am speaking with the correct person using two identifiers. ?Location patient: home ?Location provider: work ?Persons participating in the virtual visit: Wilber Bihari, Glenna Durand LPN. ?  ?I discussed the limitations, risks, security and privacy concerns of performing an evaluation and management service by telephone and the availability of in person appointments. I also discussed with the patient that there may be a patient responsible charge related to this service. The patient expressed understanding and verbally consented to this telephonic visit.  ?  ?Interactive audio and video telecommunications were attempted between this provider and patient, however failed, due to patient having technical difficulties OR patient did not have access to video capability.  We continued and completed visit with audio only. ? ?  ? ?Vital signs may be patient reported or missing. ? ?Subjective:  ? Christopher Harvey is a 76 y.o. male who presents for Medicare Annual/Subsequent preventive examination. ? ?Review of Systems    ? ?Cardiac Risk Factors include: advanced age (>104mn, >>5women);hypertension;male gender;obesity (BMI >30kg/m2) ? ?   ?Objective:  ?  ?Today's Vitals  ? 09/21/21 1601 09/21/21 1602  ?Weight: 249 lb (112.9 kg)   ?Height: '5\' 11"'$  (1.803 m)   ?PainSc:  5   ? ?Body mass index is 34.73 kg/m?. ? ? ?  09/21/2021  ?  4:06 PM 09/08/2020  ?  9:08 AM 03/29/2020  ?  7:14 PM 08/27/2019  ?  2:27 PM 08/15/2018  ?  2:38 PM  ?Advanced Directives  ?Does Patient Have a Medical Advance Directive? No Yes No Yes No  ?Type of [place]ate [place]   ?Copy of [place]?  No - copy requested  No - copy requested   ?Would patient like information on creating a medical advance directive?     No - Patient declined  ? ? ?Current Medications (verified) ?Outpatient  Encounter Medications as of 09/21/2021  ?Medication Sig  ? benztropine (COGENTIN) 1 MG tablet Take 1 mg by mouth 2 (two) times daily. Take 1/2 a tablet  ? buPROPion (WELLBUTRIN XL) 150 MG 24 hr tablet Take by mouth.  ? hydrochlorothiazide (HYDRODIURIL) 25 MG tablet Take by mouth.  ? loratadine (CLARITIN) 10 MG tablet Take by mouth.  ? naproxen (NAPROSYN) 500 MG tablet TAKE ONE TABLET BY MOUTH TWICE A DAY TAKE WITH FOOD  ? risperiDONE (RISPERDAL) 1 MG tablet Take by mouth.  ? tamsulosin (FLOMAX) 0.4 MG CAPS capsule Take 1 capsule (0.4 mg total) by mouth daily.  ? ?No facility-administered encounter medications on file as of 09/21/2021.  ? ? ?Allergies (verified) ?Patient has no known allergies.  ? ?History: ?Past Medical History:  ?Diagnosis Date  ? Hypertension   ? PTSD (post-traumatic stress disorder)   ? ?Past Surgical History:  ?Procedure Laterality Date  ? [facility] ? ?Family History  ?Problem Relation Age of Onset  ? Hypertension Mother   ? Hypertension Father   ? ?Social History  ? ?Socioeconomic History  ? Marital status: Married  ?  Spouse name: Not on file  ? Number of children: Not on file  ? Years of education: Not on file  ? Highest education level: Not on file  ?Occupational History  ? Occupation: retired  ?Tobacco Use  ? Smoking status: Never  ? Smokeless tobacco: Never  ?Vaping Use  ?  Vaping Use: Never used  ?Substance and Sexual Activity  ? Alcohol use: Yes  ?  Comment: occasionally   ? Drug use: Not Currently  ? Sexual activity: Yes  ?Other Topics Concern  ? Not on file  ?Social History Narrative  ? Not on file  ? ?Social Determinants of Health  ? ?Financial Resource Strain: Low Risk   ? Difficulty of Paying Living Expenses: Not hard at all  ?Food Insecurity: No Food Insecurity  ? Worried About Charity fundraiser in the Last Year: Never true  ? Ran Out of Food in the Last Year: Never true  ?Transportation Needs: No Transportation Needs  ? Lack of  Transportation (Medical): No  ? Lack of Transportation (Non-Medical): No  ?Physical Activity: Inactive  ? Days of Exercise per Week: 0 days  ? Minutes of Exercise per Session: 0 min  ?Stress: No Stress Concern Present  ? Feeling of Stress : Not at all  ?Social Connections: Not on file  ? ? ?Tobacco Counseling ?Counseling given: Not Answered ? ? ?Clinical Intake: ? ?Pre-visit preparation completed: Yes ? ?Pain : 0-10 ?Pain Score: 5  ?Pain Type: Acute pain ?Pain Location: Hip ?Pain Orientation: Left ?Pain Descriptors / Indicators: Burning, Tingling, Numbness, Dull ?Pain Onset: More than a month ago ?Pain Frequency: Constant ? ?  ? ?Nutritional Status: BMI > 30  Obese ?Nutritional Risks: None ?Diabetes: No ? ?How often do you need to have someone help you when you read instructions, pamphlets, or other written materials from your doctor or pharmacy?: 1 - Never ? ?Diabetic? no ? ?Interpreter Needed?: No ? ?Information entered by :: NAllen LPN ? ? ?Activities of Daily Living ? ?  09/21/2021  ?  4:08 PM  ?In your present state of health, do you have any difficulty performing the following activities:  ?Hearing? 0  ?Vision? 0  ?Difficulty concentrating or making decisions? 0  ?Walking or climbing stairs? 1  ?Dressing or bathing? 0  ?Doing errands, shopping? 0  ?Preparing Food and eating ? N  ?Using the Toilet? N  ?In the past six months, have you accidently leaked urine? N  ?Do you have problems with loss of bowel control? N  ?Managing your Medications? N  ?Managing your Finances? N  ?Housekeeping or managing your Housekeeping? N  ? ? ?Patient Care Team: ?Glendale Chard, MD as PCP - General (Internal Medicine) ? ?Indicate any recent Medical Services you may have received from other than [facility] providers in the past year (date may be approximate). ? ?   ?Assessment:  ? This is a routine wellness examination for Christopher Harvey. ? ?Hearing/Vision screen ?V[facility] Screening - Comments:: [facility] ? ?Dietary issues and  exercise activities discussed: ?Current Exercise Habits: The patient does not participate in regular exercise at present ? ? Goals Addressed   ? ?  ?  ?  ?  ? This Visit's Progress  ?  Patient Stated     ?  09/21/2021, wants to get down to 225 pounds ?  ? ?  ? ?Depression Screen ? ?  09/21/2021  ?  4:07 PM 09/14/2021  ? 11:46 AM 09/08/2020  ?  9:09 AM 08/27/2019  ?  2:28 PM 02/20/2019  ?  2:42 PM 08/15/2018  ?  2:40 PM  ?PHQ 2/9 Scores  ?PHQ - 2 Score 0 0 0 0 0 0  ?PHQ- 9 Score    0  0  ?  ?Fall Risk ? ?  09/21/2021  ?  4:07 PM 09/14/2021  ? 11:47 AM 09/08/2020  ?  9:09 AM 08/27/2019  ?  2:28 PM 02/20/2019  ?  2:42 PM  ?Fall Risk   ?Falls in the past year? 1 0 0 0 0  ?Comment slipped down the stairs      ?Number falls in past yr: 1 0     ?Injury with Fall? 0 0     ?Risk for fall due to : Medication side effect No Fall Risks Medication side effect Medication side effect   ?Follow up Falls evaluation completed;Education provided;Falls prevention discussed Falls evaluation completed Falls evaluation completed;Education provided;Falls prevention discussed Education provided;Falls evaluation completed;Falls prevention discussed   ? ? ?FALL RISK PREVENTION PERTAINING TO THE HOME: ? ?Any stairs in or around the home? Yes  ?If so, are there any without handrails? No  ?Home free of loose throw rugs in walkways, pet beds, electrical cords, etc? Yes  ?Adequate lighting in your home to reduce risk of falls? Yes  ? ?ASSISTIVE DEVICES UTILIZED TO PREVENT FALLS: ? ?Life alert? No  ?Use of a cane, walker or w/c? No  ?Grab bars in the bathroom? No  ?Shower chair or bench in shower? No  ?Elevated toilet seat or a handicapped toilet? No  ? ?TIMED UP AND GO: ? ?Was the test performed? No .  ? ? ? ? ?Cognitive Function: ?  ?  ? ?  09/21/2021  ?  4:09 PM 09/08/2020  ?  9:10 AM 08/27/2019  ?  2:30 PM 08/15/2018  ?  2:42 PM  ?6CIT Screen  ?What Year? 0 points 0 points 0 points 0 points  ?What month? 0 points 0 points 0 points 0 points  ?What time? 0 points 0  points 0 points 0 points  ?Count back from 20 0 points 0 points 0 points 0 points  ?Months in reverse 0 points 0 points 0 points 0 points  ?Repeat phrase 0 points 0 points 6 points 0 points  ?Total Score 0 points

## 2021-09-21 NOTE — Patient Instructions (Signed)
Christopher Harvey , ?Thank you for taking time to come for your Medicare Wellness Visit. I appreciate your ongoing commitment to your health goals. Please review the following plan we discussed and let me know if I can assist you in the future.  ? ?Screening recommendations/referrals: ?Colonoscopy: completed 10/14/2020, due 10/15/2023 ?Recommended yearly ophthalmology/optometry visit for glaucoma screening and checkup ?Recommended yearly dental visit for hygiene and checkup ? ?Vaccinations: ?Influenza vaccine: due 01/03/2022 ?Pneumococcal vaccine: completed 10/04/2017 ?Tdap vaccine: completed 08/17/2020, due 08/18/2030 ?Shingles vaccine: completed    ?Covid-19: 05/20/2021, 03/16/2020, 08/12/2019, 07/22/2019 ? ?Advanced directives: Advance directive discussed with you today.  ? ?Conditions/risks identified: none ? ?Next appointment: Follow up in one year for your annual wellness visit.  ? ?Preventive Care 75 Years and Older, Male ?Preventive care refers to lifestyle choices and visits with your health care provider that can promote health and wellness. ?What does preventive care include? ?A yearly physical exam. This is also called an annual well check. ?Dental exams once or twice a year. ?Routine eye exams. Ask your health care provider how often you should have your eyes checked. ?Personal lifestyle choices, including: ?Daily care of your teeth and gums. ?Regular physical activity. ?Eating a healthy diet. ?Avoiding tobacco and drug use. ?Limiting alcohol use. ?Practicing safe sex. ?Taking low doses of aspirin every day. ?Taking vitamin and mineral supplements as recommended by your health care provider. ?What happens during an annual well check? ?The services and screenings done by your health care provider during your annual well check will depend on your age, overall health, lifestyle risk factors, and family history of disease. ?Counseling  ?Your health care provider may ask you questions about your: ?Alcohol use. ?Tobacco  use. ?Drug use. ?Emotional well-being. ?Home and relationship well-being. ?Sexual activity. ?Eating habits. ?History of falls. ?Memory and ability to understand (cognition). ?Work and work Statistician. ?Screening  ?You may have the following tests or measurements: ?Height, weight, and BMI. ?Blood pressure. ?Lipid and cholesterol levels. These may be checked every 5 years, or more frequently if you are over 22 years old. ?Skin check. ?Lung cancer screening. You may have this screening every year starting at age 75 if you have a 30-pack-year history of smoking and currently smoke or have quit within the past 15 years. ?Fecal occult blood test (FOBT) of the stool. You may have this test every year starting at age 75. ?Flexible sigmoidoscopy or colonoscopy. You may have a sigmoidoscopy every 5 years or a colonoscopy every 10 years starting at age 75. ?Prostate cancer screening. Recommendations will vary depending on your family history and other risks. ?Hepatitis C blood test. ?Hepatitis B blood test. ?Sexually transmitted disease (STD) testing. ?Diabetes screening. This is done by checking your blood sugar (glucose) after you have not eaten for a while (fasting). You may have this done every 1-3 years. ?Abdominal aortic aneurysm (AAA) screening. You may need this if you are a current or former smoker. ?Osteoporosis. You may be screened starting at age 75 if you are at high risk. ?Talk with your health care provider about your test results, treatment options, and if necessary, the need for more tests. ?Vaccines  ?Your health care provider may recommend certain vaccines, such as: ?Influenza vaccine. This is recommended every year. ?Tetanus, diphtheria, and acellular pertussis (Tdap, Td) vaccine. You may need a Td booster every 10 years. ?Zoster vaccine. You may need this after age 75. ?Pneumococcal 13-valent conjugate (PCV13) vaccine. One dose is recommended after age 75. ?Pneumococcal polysaccharide (PPSV23) vaccine.  One dose is recommended after age 75. ?Talk to your health care provider about which screenings and vaccines you need and how often you need them. ?This information is not intended to replace advice given to you by your health care provider. Make sure you discuss any questions you have with your health care provider. ?Document Released: 06/18/2015 Document Revised: 02/09/2016 Document Reviewed: 03/23/2015 ?Elsevier Interactive Patient Education ? 2017 [employer]. ? ?Fall Prevention in the Home ?Falls can cause injuries. They can happen to people of all ages. There are many things you can do to make your home safe and to help prevent falls. ?What can I do on the outside of my home? ?Regularly fix the edges of walkways and driveways and fix any cracks. ?Remove anything that might make you trip as you walk through a door, such as a raised step or threshold. ?Trim any bushes or trees on the path to your home. ?Use bright outdoor lighting. ?Clear any walking paths of anything that might make someone trip, such as rocks or tools. ?Regularly check to see if handrails are loose or broken. Make sure that both sides of any steps have handrails. ?Any raised decks and porches should have guardrails on the edges. ?Have any leaves, snow, or ice cleared regularly. ?Use sand or salt on walking paths during winter. ?Clean up any spills in your garage right away. This includes oil or grease spills. ?What can I do in the bathroom? ?Use night lights. ?Install grab bars by the toilet and in the tub and shower. Do not use towel bars as grab bars. ?Use non-skid mats or decals in the tub or shower. ?If you need to sit down in the shower, use a plastic, non-slip stool. ?Keep the floor dry. Clean up any water that spills on the floor as soon as it happens. ?Remove soap buildup in the tub or shower regularly. ?Attach bath mats securely with double-sided non-slip rug tape. ?Do not have throw rugs and other things on the floor that can make  you trip. ?What can I do in the bedroom? ?Use night lights. ?Make sure that you have a light by your bed that is easy to reach. ?Do not use any sheets or blankets that are too big for your bed. They should not hang down onto the floor. ?Have a firm chair that has side arms. You can use this for support while you get dressed. ?Do not have throw rugs and other things on the floor that can make you trip. ?What can I do in the kitchen? ?Clean up any spills right away. ?Avoid walking on wet floors. ?Keep items that you use a lot in easy-to-reach places. ?If you need to reach something above you, use a strong step stool that has a grab bar. ?Keep electrical cords out of the way. ?Do not use floor polish or wax that makes floors slippery. If you must use wax, use non-skid floor wax. ?Do not have throw rugs and other things on the floor that can make you trip. ?What can I do with my stairs? ?Do not leave any items on the stairs. ?Make sure that there are handrails on both sides of the stairs and use them. Fix handrails that are broken or loose. Make sure that handrails are as long as the stairways. ?Check any carpeting to make sure that it is firmly attached to the stairs. Fix any carpet that is loose or worn. ?Avoid having throw rugs at the top or bottom of the  stairs. If you do have throw rugs, attach them to the floor with carpet tape. ?Make sure that you have a light switch at the top of the stairs and the bottom of the stairs. If you do not have them, ask someone to add them for you. ?What else can I do to help prevent falls? ?Wear shoes that: ?Do not have high heels. ?Have rubber bottoms. ?Are comfortable and fit you well. ?Are closed at the toe. Do not wear sandals. ?If you use a stepladder: ?Make sure that it is fully opened. Do not climb a closed stepladder. ?Make sure that both sides of the stepladder are locked into place. ?Ask someone to hold it for you, if possible. ?Clearly mark and make sure that you can  see: ?Any grab bars or handrails. ?First and last steps. ?Where the edge of each step is. ?Use tools that help you move around (mobility aids) if they are needed. These include: ?Canes. ?Walkers. ?Scooters. ?Crutc

## 2021-09-22 ENCOUNTER — Encounter: Payer: Self-pay | Admitting: Internal Medicine

## 2021-10-13 ENCOUNTER — Encounter: Payer: Self-pay | Admitting: Internal Medicine

## 2022-01-06 ENCOUNTER — Encounter: Payer: Self-pay | Admitting: Internal Medicine

## 2022-03-03 DIAGNOSIS — R350 Frequency of micturition: Secondary | ICD-10-CM | POA: Diagnosis not present

## 2022-03-30 ENCOUNTER — Encounter: Payer: Medicare Other | Admitting: Internal Medicine

## 2022-06-14 DIAGNOSIS — R5383 Other fatigue: Secondary | ICD-10-CM | POA: Diagnosis not present

## 2022-06-14 DIAGNOSIS — R197 Diarrhea, unspecified: Secondary | ICD-10-CM | POA: Diagnosis not present

## 2022-06-14 DIAGNOSIS — R112 Nausea with vomiting, unspecified: Secondary | ICD-10-CM | POA: Diagnosis not present

## 2022-08-11 ENCOUNTER — Ambulatory Visit (INDEPENDENT_AMBULATORY_CARE_PROVIDER_SITE_OTHER): Payer: Medicare Other | Admitting: Internal Medicine

## 2022-08-11 ENCOUNTER — Encounter: Payer: Self-pay | Admitting: Internal Medicine

## 2022-08-11 VITALS — BP 140/72 | HR 88 | Temp 98.4°F | Ht 71.0 in | Wt 256.4 lb

## 2022-08-11 DIAGNOSIS — Z6835 Body mass index (BMI) 35.0-35.9, adult: Secondary | ICD-10-CM

## 2022-08-11 DIAGNOSIS — N182 Chronic kidney disease, stage 2 (mild): Secondary | ICD-10-CM

## 2022-08-11 DIAGNOSIS — R5383 Other fatigue: Secondary | ICD-10-CM | POA: Diagnosis not present

## 2022-08-11 DIAGNOSIS — Z0001 Encounter for general adult medical examination with abnormal findings: Secondary | ICD-10-CM

## 2022-08-11 DIAGNOSIS — R7309 Other abnormal glucose: Secondary | ICD-10-CM | POA: Diagnosis not present

## 2022-08-11 DIAGNOSIS — Z0000 Encounter for general adult medical examination without abnormal findings: Secondary | ICD-10-CM

## 2022-08-11 DIAGNOSIS — M5442 Lumbago with sciatica, left side: Secondary | ICD-10-CM | POA: Diagnosis not present

## 2022-08-11 DIAGNOSIS — G8929 Other chronic pain: Secondary | ICD-10-CM

## 2022-08-11 DIAGNOSIS — I129 Hypertensive chronic kidney disease with stage 1 through stage 4 chronic kidney disease, or unspecified chronic kidney disease: Secondary | ICD-10-CM | POA: Diagnosis not present

## 2022-08-11 LAB — POCT URINALYSIS DIPSTICK
Bilirubin, UA: NEGATIVE
Glucose, UA: NEGATIVE
Ketones, UA: NEGATIVE
Leukocytes, UA: NEGATIVE
Nitrite, UA: NEGATIVE
Protein, UA: NEGATIVE
Spec Grav, UA: 1.02 (ref 1.010–1.025)
Urobilinogen, UA: 1 E.U./dL
pH, UA: 6.5 (ref 5.0–8.0)

## 2022-08-11 MED ORDER — TRIAMCINOLONE ACETONIDE 40 MG/ML IJ SUSP
40.0000 mg | Freq: Once | INTRAMUSCULAR | Status: AC
  Administered 2022-08-11: 40 mg via INTRAMUSCULAR

## 2022-08-11 MED ORDER — TIZANIDINE HCL 4 MG PO TABS: 4.0000 mg | ORAL_TABLET | Freq: Every day | ORAL | 1 refills | Status: AC

## 2022-08-11 NOTE — Patient Instructions (Signed)
Health Maintenance, Male Adopting a healthy lifestyle and getting preventive care are important in promoting health and wellness. Ask your health care provider about: The right schedule for you to have regular tests and exams. Things you can do on your own to prevent diseases and keep yourself healthy. What should I know about diet, weight, and exercise? Eat a healthy diet  Eat a diet that includes plenty of vegetables, fruits, low-fat dairy products, and lean protein. Do not eat a lot of foods that are high in solid fats, added sugars, or sodium. Maintain a healthy weight Body mass index (BMI) is a measurement that can be used to identify possible weight problems. It estimates body fat based on height and weight. Your health care provider can help determine your BMI and help you achieve or maintain a healthy weight. Get regular exercise Get regular exercise. This is one of the most important things you can do for your health. Most adults should: Exercise for at least 150 minutes each week. The exercise should increase your heart rate and make you sweat (moderate-intensity exercise). Do strengthening exercises at least twice a week. This is in addition to the moderate-intensity exercise. Spend less time sitting. Even light physical activity can be beneficial. Watch cholesterol and blood lipids Have your blood tested for lipids and cholesterol at 76 years of age, then have this test every 5 years. You may need to have your cholesterol levels checked more often if: Your lipid or cholesterol levels are high. You are older than 76 years of age. You are at high risk for heart disease. What should I know about cancer screening? Many types of cancers can be detected early and may often be prevented. Depending on your health history and family history, you may need to have cancer screening at various ages. This may include screening for: Colorectal cancer. Prostate cancer. Skin cancer. Lung  cancer. What should I know about heart disease, diabetes, and high blood pressure? Blood pressure and heart disease High blood pressure causes heart disease and increases the risk of stroke. This is more likely to develop in people who have high blood pressure readings or are overweight. Talk with your health care provider about your target blood pressure readings. Have your blood pressure checked: Every 3-5 years if you are 18-39 years of age. Every year if you are 40 years old or older. If you are between the ages of 65 and 75 and are a current or former smoker, ask your health care provider if you should have a one-time screening for abdominal aortic aneurysm (AAA). Diabetes Have regular diabetes screenings. This checks your fasting blood sugar level. Have the screening done: Once every three years after age 45 if you are at a normal weight and have a low risk for diabetes. More often and at a younger age if you are overweight or have a high risk for diabetes. What should I know about preventing infection? Hepatitis B If you have a higher risk for hepatitis B, you should be screened for this virus. Talk with your health care provider to find out if you are at risk for hepatitis B infection. Hepatitis C Blood testing is recommended for: Everyone born from 1945 through 1965. Anyone with known risk factors for hepatitis C. Sexually transmitted infections (STIs) You should be screened each year for STIs, including gonorrhea and chlamydia, if: You are sexually active and are younger than 76 years of age. You are older than 76 years of age and your   health care provider tells you that you are at risk for this type of infection. Your sexual activity has changed since you were last screened, and you are at increased risk for chlamydia or gonorrhea. Ask your health care provider if you are at risk. Ask your health care provider about whether you are at high risk for HIV. Your health care provider  may recommend a prescription medicine to help prevent HIV infection. If you choose to take medicine to prevent HIV, you should first get tested for HIV. You should then be tested every 3 months for as long as you are taking the medicine. Follow these instructions at home: Alcohol use Do not drink alcohol if your health care provider tells you not to drink. If you drink alcohol: Limit how much you have to 0-2 drinks a day. Know how much alcohol is in your drink. In the [place] one drink equals one 12 oz bottle of beer (355 mL), one 5 oz glass of wine (148 mL), or one 1 oz glass of hard liquor (44 mL). Lifestyle Do not use any products that contain nicotine or tobacco. These products include cigarettes, chewing tobacco, and vaping devices, such as e-cigarettes. If you need help quitting, ask your health care provider. Do not use street drugs. Do not share needles. Ask your health care provider for help if you need support or information about quitting drugs. General instructions Schedule regular health, dental, and eye exams. Stay current with your vaccines. Tell your health care provider if: You often feel depressed. You have ever been abused or do not feel safe at home. Summary Adopting a healthy lifestyle and getting preventive care are important in promoting health and wellness. Follow your health care provider's instructions about healthy diet, exercising, and getting tested or screened for diseases. Follow your health care provider's instructions on monitoring your cholesterol and blood pressure. This information is not intended to replace advice given to you by your health care provider. Make sure you discuss any questions you have with your health care provider. Document Revised: 10/11/2020 Document Reviewed: 10/11/2020 Elsevier Patient Education  2023 Elsevier Inc.  

## 2022-08-11 NOTE — Progress Notes (Unsigned)
Barnet Glasgow Martin,acting as a Education administrator for [name], MD.,have documented all relevant documentation on the behalf of [name], MD,as directed by  [name], MD while in the presence of [name], MD.   Subjective:     Patient ID: Christopher Harvey , male    DOB: Nov 14, 1945 , 77 y.o.   MRN: [id number]   Chief Complaint  Patient presents with   Annual Exam   Hypertension    HPI  Patient presents today for HM, patient states compliance with medications. Patient states his sciatic nerve has been hurting bad in the last 4 days. Patient did not take BP medication today due to having 2 appts this morning.  BP Readings from Last 3 Encounters: 08/11/22 : (!) 160/78 09/14/21 : 124/70 03/14/21 : 136/74       Past Medical History:  Diagnosis Date   Hypertension    PTSD (post-traumatic stress disorder)      Family History  Problem Relation Age of Onset   Hypertension Mother    Hypertension Father      Current Outpatient Medications:    buPROPion (WELLBUTRIN XL) 150 MG 24 hr tablet, Take by mouth., Disp: , Rfl:    hydrochlorothiazide (HYDRODIURIL) 25 MG tablet, Take by mouth., Disp: , Rfl:    loratadine (CLARITIN) 10 MG tablet, Take by mouth., Disp: , Rfl:    tamsulosin (FLOMAX) 0.4 MG CAPS capsule, Take 1 capsule (0.4 mg total) by mouth daily., Disp: 90 capsule, Rfl: 2   No Known Allergies   Men's preventive visit. Patient Health Questionnaire (PHQ-2) is  [place] Office Visit from 08/11/2022 in [facility]  PHQ-2 Total Score 0     . Patient is on a liberal/low sodium diet. Marital status: Married. Relevant history for alcohol use is:  Social History   Substance and Sexual Activity  Alcohol Use Yes   Comment: occasionally   . Relevant history for tobacco use is:  Social History   Tobacco Use  Smoking Status Never  Smokeless Tobacco Never  .   Review of Systems  Constitutional: Negative.   HENT:  Negative.    Eyes: Negative.   Respiratory: Negative.    Cardiovascular: Negative.   Endocrine: Negative.   Genitourinary: Negative.   Musculoskeletal:  Positive for back pain.  Skin: Negative.   Allergic/Immunologic: Negative.   Hematological: Negative.   Psychiatric/Behavioral: Negative.       Today's Vitals   08/11/22 1426 08/11/22 1437  BP: (!) 160/78 (!) 140/72  Pulse: 88   Temp: 98.4 F (36.9 C)   TempSrc: Oral   Weight: 256 lb 6.4 oz (116.3 kg)   Height: '5\' 11"'$  (1.803 m)   PainSc: 4    PainLoc: Leg    Body mass index is 35.76 kg/m.  Wt Readings from Last 3 Encounters:  08/11/22 256 lb 6.4 oz (116.3 kg)  09/21/21 249 lb (112.9 kg)  09/14/21 249 lb 12.8 oz (113.3 kg)    Objective:  Physical Exam Vitals and nursing note reviewed.  Constitutional:      Appearance: Normal appearance.  Cardiovascular:     Rate and Rhythm: Normal rate and regular rhythm.     Heart sounds: Normal heart sounds.  Pulmonary:     Effort: Pulmonary effort is normal.     Breath sounds: Normal breath sounds.  Musculoskeletal:     Comments: Pos straight leg test on the left  Skin:    General: Skin is warm.  Neurological:     General: No focal deficit present.     Mental Status: He is alert.  Psychiatric:        Mood and Affect: Mood normal.      Assessment And Plan:    1. Annual physical exam  2. Parenchymal renal hypertension, stage 1 through stage 4 or unspecified chronic kidney disease Comments: EKG performed, NSR w/ nonspecific T abnormality - EKG 12-Lead - Microalbumin / creatinine urine ratio - POCT urinalysis dipstick - CBC - CMP14+EGFR - Lipid panel - TSH  3. Chronic renal disease, stage II - CMP14+EGFR  4. Chronic left-sided low back pain with left-sided sciatica  5. Other fatigue - Testosterone,Free and Total - TSH  6. Severe obesity with body mass index (BMI) of 35.0 to 35.9 and comorbidity [facility])  Patient was given opportunity to ask questions. Patient  verbalized understanding of the plan and was able to repeat key elements of the plan. All questions were answered to their satisfaction.   I, [name], MD, have reviewed all documentation for this visit. The documentation on 08/11/22 for the exam, diagnosis, procedures, and orders are all accurate and complete.   THE PATIENT IS ENCOURAGED TO PRACTICE SOCIAL DISTANCING DUE TO THE COVID-19 PANDEMIC.

## 2022-08-12 IMAGING — MR MR LUMBAR SPINE W/O CM
4 of 5 series · 26 of 48 positions shown · non-contrast
Comparison: None.

CLINICAL DATA: Lumbar radiculopathy with left hip and leg pain

EXAM:
MRI LUMBAR SPINE WITHOUT CONTRAST
TECHNIQUE: Multiplanar, multisequence MR imaging of the lumbar spine was
performed. No intravenous contrast was administered.

[Series 3: T2 · sagittal · 4.0mm · 0.81mm/px · 6 of 17 slices shown (1 of 2)]
[im 1/17]
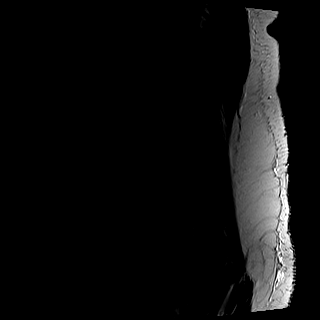
[im 4/17]
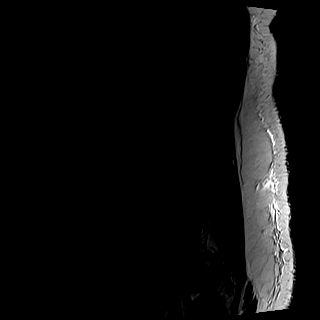
[im 7/17]
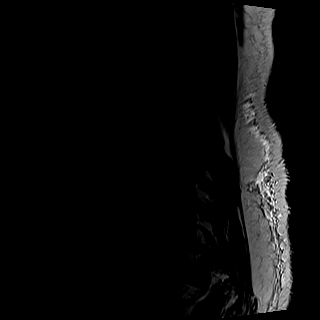
[im 10/17]
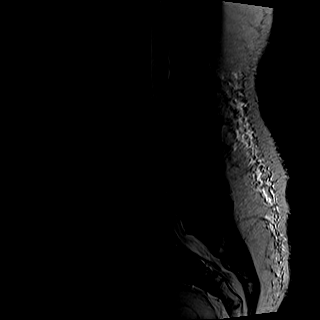
[im 13/17]
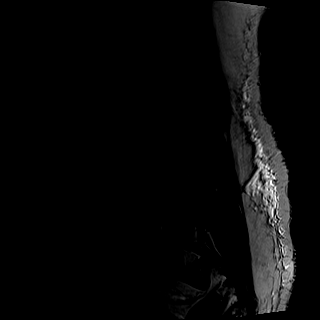
[im 17/17]
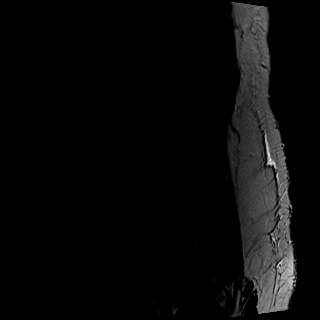

[Series 4: T1 · sagittal · 4.0mm · 0.41mm/px · 6 of 17 slices shown (1 of 2)]
[im 1/17]
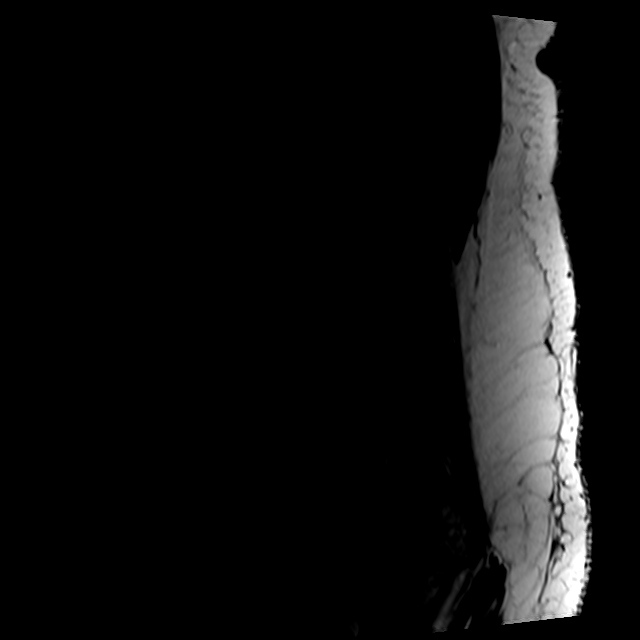
[im 4/17]
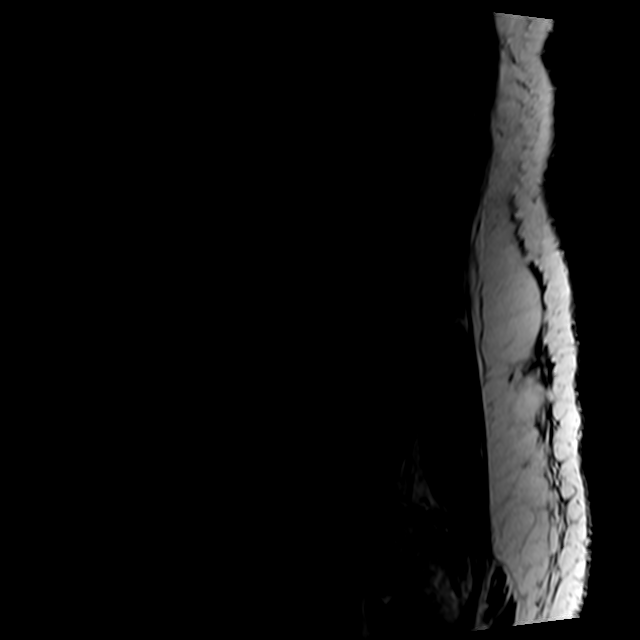
[im 7/17]
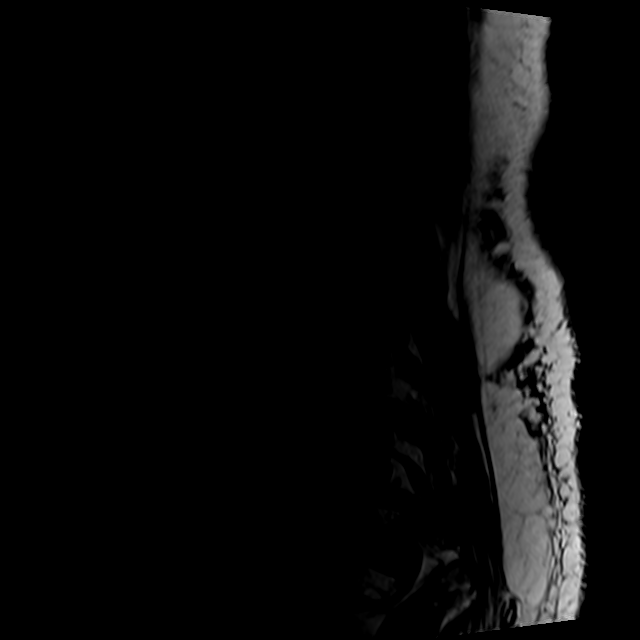
[im 10/17]
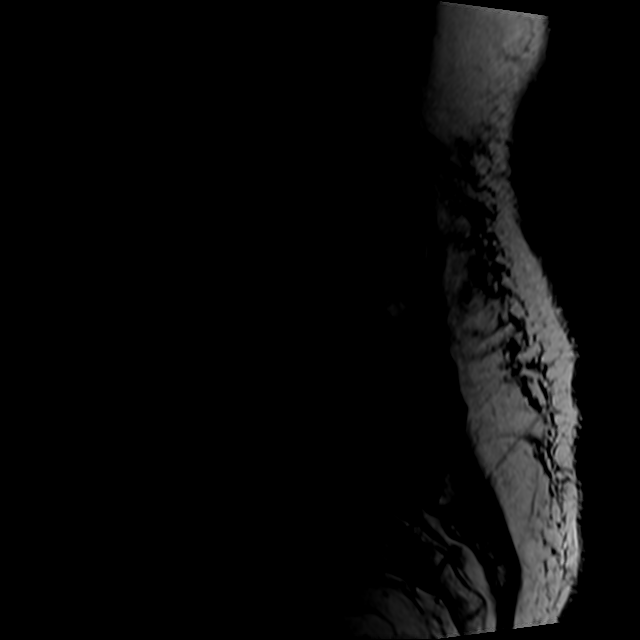
[im 13/17]
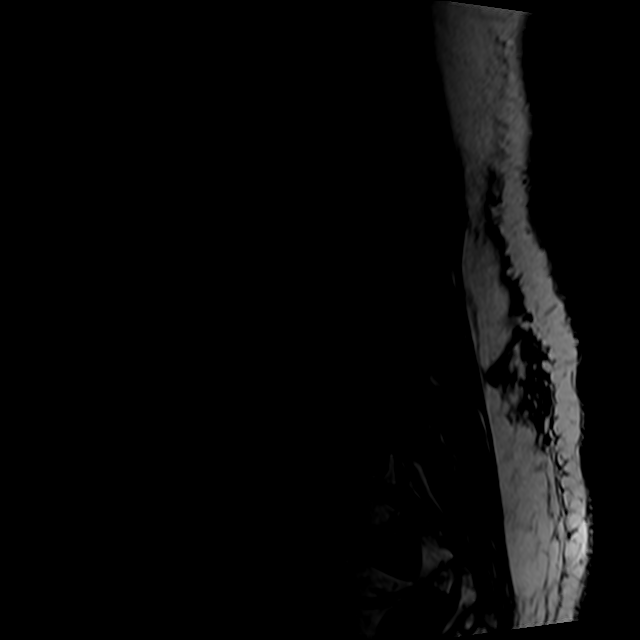
[im 17/17]
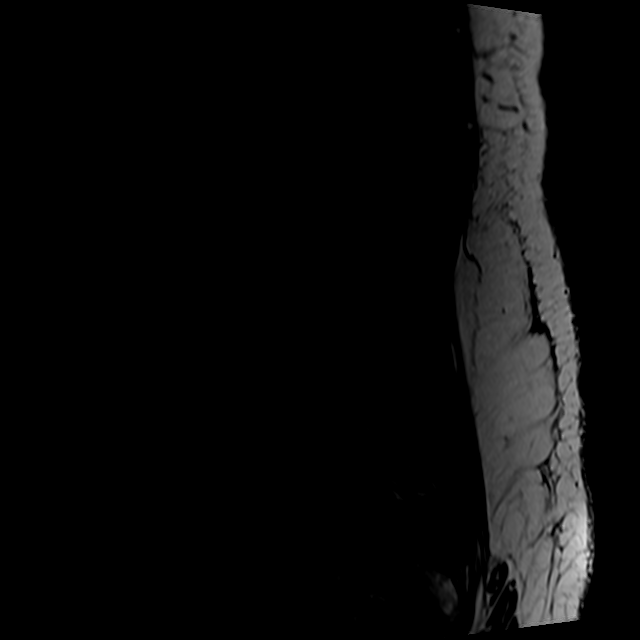

[Series 9: T2 · axial · 4.0mm · 0.78mm/px · z∈[-196,+47]mm · 9 of 45 slices shown (2 of 2)]
[im 1/45]
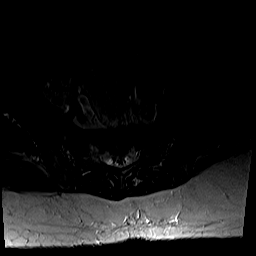
[im 7/45]
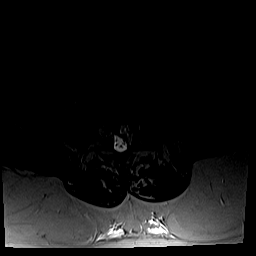
[im 13/45]
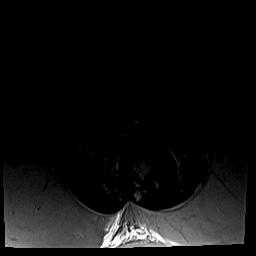
[im 19/45]
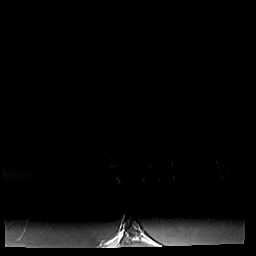
[im 23/45]
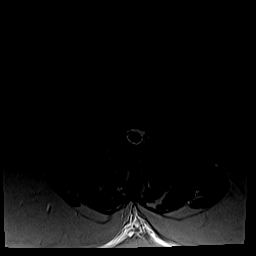
[im 26/45]
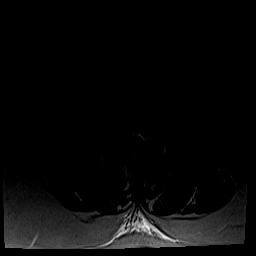
[im 32/45]
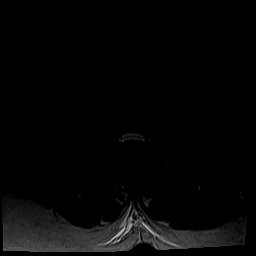
[im 38/45]
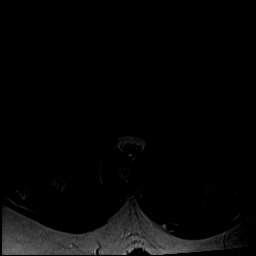
[im 45/45]
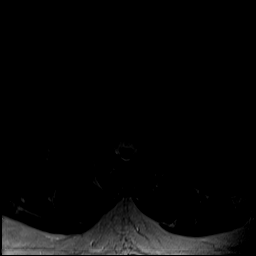

[Series 10: T1 · axial · 4.0mm · 0.39mm/px · z∈[-196,+12]mm · 5 of 45 slices shown (2 of 2)]
[im 1/45]
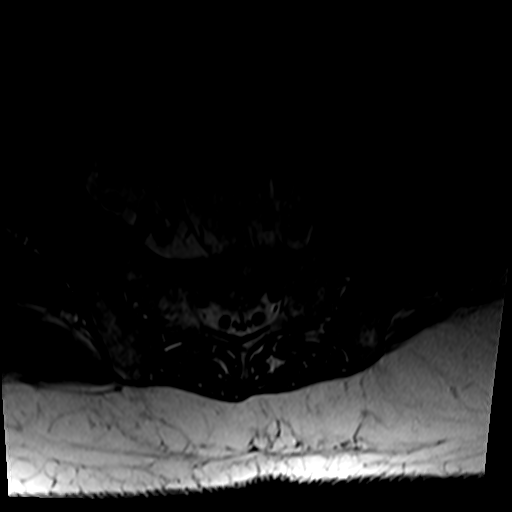
[im 7/45]
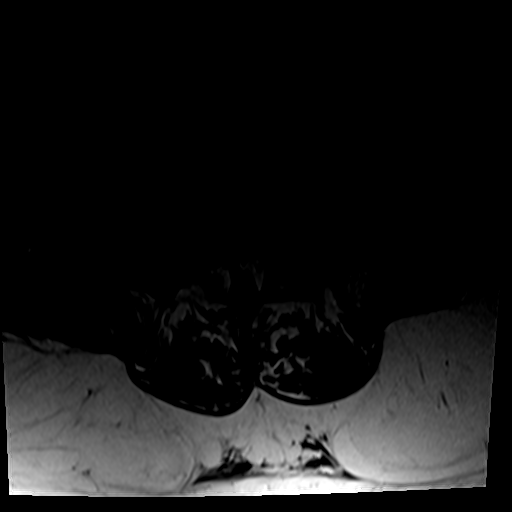
[im 13/45]
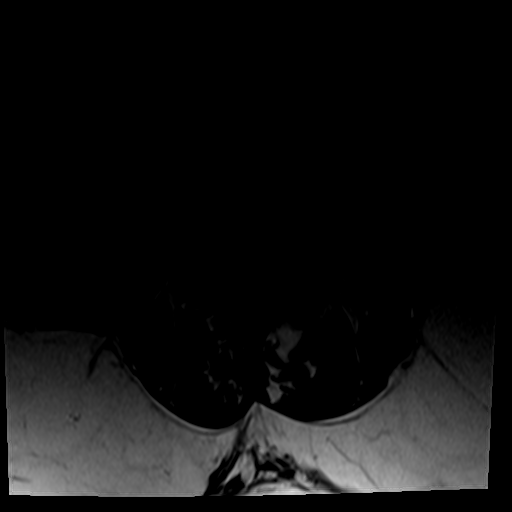
[im 23/45]
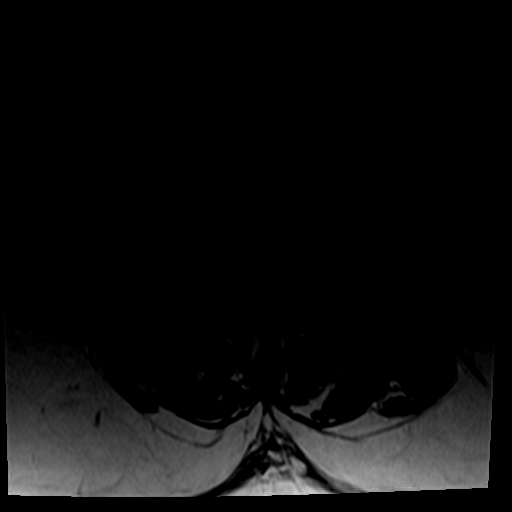
[im 38/45]
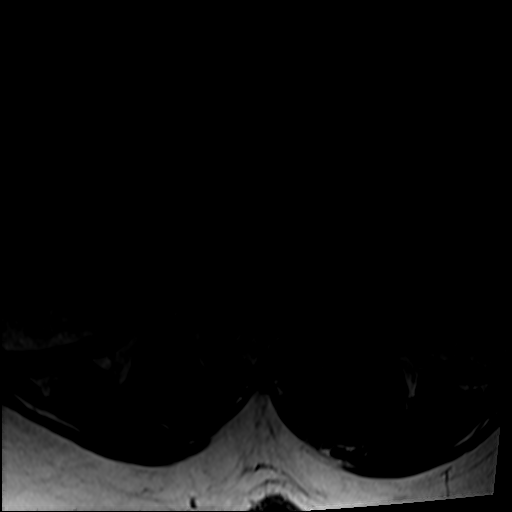

[26 of 48 positions shown; findings below may reference images not displayed]

FINDINGS: Segmentation:  Standard.

Alignment:  Grade 1 retrolisthesis at L3-4 and L5-S1

Vertebrae:  Modic type 2 endplate changes at L2-3 and L5-S1

Conus medullaris and cauda equina: Conus extends to the L1 level.
Conus and cauda equina appear normal.

Paraspinal and other soft tissues: Negative

Disc levels:

L1-L2: Normal disc space and facet joints. No spinal canal stenosis.
No neural foraminal stenosis.

L2-L3: Small disc bulge. Right lateral recess narrowing without
central spinal canal stenosis. Mild right neural foraminal stenosis.

L3-L4: Small disc bulge. Moderate spinal canal stenosis. Mass effect
on the left L4 nerve root in the lateral recess. Mild bilateral
neural foraminal stenosis.

L4-L5: Medium-sized disc bulge with moderate facet hypertrophy.
Moderate spinal canal stenosis. No neural foraminal stenosis.

L5-S1: Medium-sized disc bulge with endplate spurring. Narrowing of
both lateral recesses without central spinal canal stenosis. Mild
right and severe left neural foraminal stenosis.

Visualized sacrum: Normal.
IMPRESSION: 1. Moderate spinal canal stenosis at L3-4 and L4-5 due to
combination of disc bulges and facet arthrosis.
2. Severe left L5-S1 neural foraminal stenosis and bilateral latera
recess stenosis.
3. Mass effect on the left L4 nerve root in the lateral recess at
L3-4.

## 2022-08-14 LAB — LIPID PANEL
Chol/HDL Ratio: 4.8 ratio (ref 0.0–5.0)
Cholesterol, Total: 177 mg/dL (ref 100–199)
HDL: 37 mg/dL — ABNORMAL LOW (ref >39)
LDL Chol Calc (NIH): 95 mg/dL (ref 0–99)
Triglycerides: 267 mg/dL — ABNORMAL HIGH (ref 0–149)
VLDL Cholesterol Cal: 45 mg/dL — ABNORMAL HIGH (ref 5–40)

## 2022-08-14 LAB — CMP14+EGFR
ALT: 24 IU/L (ref 0–44)
AST: 19 IU/L (ref 0–40)
Albumin/Globulin Ratio: 1.6 (ref 1.2–2.2)
Albumin: 4.2 g/dL (ref 3.8–4.8)
Alkaline Phosphatase: 94 IU/L (ref 44–121)
BUN/Creatinine Ratio: 12 (ref 10–24)
BUN: 13 mg/dL (ref 8–27)
Bilirubin Total: 0.2 mg/dL (ref 0.0–1.2)
CO2: 25 mmol/L (ref 20–29)
Calcium: 9.5 mg/dL (ref 8.6–10.2)
Chloride: 101 mmol/L (ref 96–106)
Creatinine, Ser: 1.13 mg/dL (ref 0.76–1.27)
Globulin, Total: 2.6 g/dL (ref 1.5–4.5)
Glucose: 117 mg/dL — ABNORMAL HIGH (ref 70–99)
Potassium: 4.6 mmol/L (ref 3.5–5.2)
Sodium: 142 mmol/L (ref 134–144)
Total Protein: 6.8 g/dL (ref 6.0–8.5)
eGFR: 68 mL/min/{1.73_m2} (ref >59)

## 2022-08-14 LAB — TSH: TSH: 1.52 u[IU]/mL (ref 0.450–4.500)

## 2022-08-14 LAB — CBC
Hematocrit: 41.9 % (ref 37.5–51.0)
Hemoglobin: 14.4 g/dL (ref 13.0–17.7)
MCH: 31.3 pg (ref 26.6–33.0)
MCHC: 34.4 g/dL (ref 31.5–35.7)
MCV: 91 fL (ref 79–97)
Platelets: 205 10*3/uL (ref 150–450)
RBC: 4.6 x10E6/uL (ref 4.14–5.80)
RDW: 11 % — ABNORMAL LOW (ref 11.6–15.4)
WBC: 6.9 10*3/uL (ref 3.4–10.8)

## 2022-08-14 LAB — TESTOSTERONE,FREE AND TOTAL
Testosterone, Free: 3.2 pg/mL — ABNORMAL LOW (ref 6.6–18.1)
Testosterone: 424 ng/dL (ref 264–916)

## 2022-08-14 LAB — MICROALBUMIN / CREATININE URINE RATIO
Creatinine, Urine: 183.1 mg/dL
Microalb/Creat Ratio: 11 mg/g creat (ref 0–29)
Microalbumin, Urine: 20.4 ug/mL

## 2022-08-16 ENCOUNTER — Encounter: Payer: Medicare Other | Admitting: Internal Medicine

## 2022-08-16 LAB — SPECIMEN STATUS REPORT

## 2022-08-16 LAB — HGB A1C W/O EAG: Hgb A1c MFr Bld: 5.7 % — ABNORMAL HIGH (ref 4.8–5.6)

## 2022-08-18 DIAGNOSIS — R350 Frequency of micturition: Secondary | ICD-10-CM | POA: Diagnosis not present

## 2022-08-18 DIAGNOSIS — R3915 Urgency of urination: Secondary | ICD-10-CM | POA: Diagnosis not present

## 2022-09-16 HISTORY — PX: OTHER SURGICAL HISTORY: SHX169

## 2022-10-04 ENCOUNTER — Ambulatory Visit (INDEPENDENT_AMBULATORY_CARE_PROVIDER_SITE_OTHER): Payer: Medicare Other | Admitting: Internal Medicine

## 2022-10-04 ENCOUNTER — Ambulatory Visit (INDEPENDENT_AMBULATORY_CARE_PROVIDER_SITE_OTHER): Payer: Medicare Other

## 2022-10-04 ENCOUNTER — Encounter: Payer: Self-pay | Admitting: Internal Medicine

## 2022-10-04 VITALS — BP 116/64 | HR 97 | Temp 98.1°F | Ht 69.0 in | Wt 252.0 lb

## 2022-10-04 VITALS — BP 116/64 | HR 97 | Temp 98.1°F | Ht 69.4 in | Wt 252.8 lb

## 2022-10-04 DIAGNOSIS — N182 Chronic kidney disease, stage 2 (mild): Secondary | ICD-10-CM

## 2022-10-04 DIAGNOSIS — Z6837 Body mass index (BMI) 37.0-37.9, adult: Secondary | ICD-10-CM

## 2022-10-04 DIAGNOSIS — Z0000 Encounter for general adult medical examination without abnormal findings: Secondary | ICD-10-CM

## 2022-10-04 DIAGNOSIS — I129 Hypertensive chronic kidney disease with stage 1 through stage 4 chronic kidney disease, or unspecified chronic kidney disease: Secondary | ICD-10-CM

## 2022-10-04 DIAGNOSIS — R1032 Left lower quadrant pain: Secondary | ICD-10-CM | POA: Diagnosis not present

## 2022-10-04 DIAGNOSIS — M48062 Spinal stenosis, lumbar region with neurogenic claudication: Secondary | ICD-10-CM

## 2022-10-04 NOTE — Progress Notes (Addendum)
Subjective:   Christopher Harvey is a 77 y.o. male who presents for Medicare Annual/Subsequent preventive examination.  Patient Medicare AWV questionnaire was completed by the patient on 09/30/2022; I have confirmed that all information answered by patient is correct and no changes since this date.     Review of Systems     Cardiac Risk Factors include: advanced age (>78men, >93 women);hypertension;male gender;obesity (BMI >30kg/m2)     Objective:    Today's Vitals   10/04/22 1051  BP: 116/64  Pulse: 97  Temp: 98.1 F (36.7 C)  TempSrc: Oral  SpO2: 94%  Weight: 252 lb 12.8 oz (114.7 kg)  Height: 5' 9.4" (1.763 m)   Body mass index is 36.9 kg/m.     10/04/2022   11:01 AM 09/21/2021    4:06 PM 09/08/2020    9:08 AM 03/29/2020    7:14 PM 08/27/2019    2:27 PM 08/15/2018    2:38 PM  Advanced Directives  Does Patient Have a Medical Advance Directive? No No Yes No Yes No  Type of Cytogeneticist of [place];Living will   Copy of Healthcare Power of Attorney in Chart?   No - copy requested  No - copy requested   Would patient like information on creating a medical advance directive? No - Patient declined     No - Patient declined    Current Medications (verified) Outpatient Encounter Medications as of 10/04/2022  Medication Sig   buPROPion (WELLBUTRIN XL) 150 MG 24 hr tablet Take by mouth.   hydrochlorothiazide (HYDRODIURIL) 25 MG tablet Take by mouth.   hydrOXYzine (ATARAX) 25 MG tablet 25 mg.   loratadine (CLARITIN) 10 MG tablet Take by mouth.   tamsulosin (FLOMAX) 0.4 MG CAPS capsule Take 1 capsule (0.4 mg total) by mouth daily.   citalopram (CELEXA) 40 MG tablet 1 tablet orally once a day   tiZANidine (ZANAFLEX) 4 MG tablet Take 1 tablet (4 mg total) by mouth daily. (Patient not taking: Reported on 10/04/2022)   No facility-administered encounter medications on file as of 10/04/2022.    Allergies (verified) Patient has no  known allergies.   History: Past Medical History:  Diagnosis Date   Cataract    Removed by [facility] [place] [facility]   Hypertension    PTSD (post-traumatic stress disorder)    Past Surgical History:  Procedure Laterality Date   CATARACT EXTRACTION, BILATERAL Bilateral 2019   at [facility]   Family History  Problem Relation Age of Onset   Hypertension Mother    Hypertension Father    Stroke Father    Social History   Socioeconomic History   Marital status: Married    Spouse name: Not on file   Number of children: Not on file   Years of education: Not on file   Highest education level: Not on file  Occupational History   Occupation: retired  Tobacco Use   Smoking status: Never   Smokeless tobacco: Never  Vaping Use   Vaping Use: Never used  Substance and Sexual Activity   Alcohol use: Yes    Alcohol/week: 1.0 - 4.0 standard drink of alcohol    Types: 1 - 2 Glasses of wine per week    Comment: occasionally    Drug use: Not Currently   Sexual activity: Not Currently  Other Topics Concern   Not on file  Social History Narrative   Not on file   Social Determinants of  Health   Financial Resource Strain: Low Risk  (09/30/2022)   Overall Financial Resource Strain (CARDIA)    Difficulty of Paying Living Expenses: Not hard at all  Food Insecurity: Food Insecurity Present (09/30/2022)   Hunger Vital Sign    Worried About Running Out of Food in the Last Year: Sometimes true    Ran Out of Food in the Last Year: Sometimes true  Transportation Needs: No Transportation Needs (09/30/2022)   PRAPARE - Administrator, Civil Service (Medical): No    Lack of Transportation (Non-Medical): No  Physical Activity: Insufficiently Active (09/30/2022)   Exercise Vital Sign    Days of Exercise per Week: 1 day    Minutes of Exercise per Session: 10 min  Stress: No Stress Concern Present (09/30/2022)   [employer] of Occupational Health - Occupational Stress Questionnaire     Feeling of Stress : Only a little  Social Connections: Unknown (09/30/2022)   Social Connection and Isolation Panel [NHANES]    Frequency of Communication with Friends and Family: Once a week    Frequency of Social Gatherings with Friends and Family: Once a week    Attends Religious Services: Not on Marketing executive or Organizations: Yes    Attends Banker Meetings: 1 to 4 times per year    Marital Status: Married    Tobacco Counseling Counseling given: Not Answered   Clinical Intake:  Pre-visit preparation completed: Yes  Pain : No/denies pain     Nutritional Status: BMI > 30  Obese Nutritional Risks: None Diabetes: No  How often do you need to have someone help you when you read instructions, pamphlets, or other written materials from your doctor or pharmacy?: 1 - Never  Diabetic? no  Interpreter Needed?: No  Information entered by :: NAllen LPN   Activities of Daily Living    09/30/2022    5:58 PM  In your present state of health, do you have any difficulty performing the following activities:  Hearing? 0  Vision? 0  Difficulty concentrating or making decisions? 0  Walking or climbing stairs? 1  Dressing or bathing? 0  Doing errands, shopping? 0  Preparing Food and eating ? N  Using the Toilet? N  In the past six months, have you accidently leaked urine? Y  Comment has enlarged prostate  Do you have problems with loss of bowel control? N  Managing your Medications? N  Managing your Finances? N  Housekeeping or managing your Housekeeping? N    Patient Care Team: Dorothyann Peng, MD as PCP - General (Internal Medicine)  Indicate any recent Medical Services you may have received from other than [facility] providers in the past year (date may be approximate).     Assessment:   This is a routine wellness examination for Christopher Harvey.  Hearing/Vision screen Vision Screening - Comments:: [facility]  Dietary issues and  exercise activities discussed: Current Exercise Habits: The patient does not participate in regular exercise at present   Goals Addressed             This Visit's Progress    Patient Stated       10/04/2022, wantsto lose weight       Depression Screen    10/04/2022   11:01 AM 08/11/2022    2:25 PM 09/21/2021    4:07 PM 09/14/2021   11:46 AM 09/08/2020    9:09 AM 08/27/2019    2:28 PM 02/20/2019  2:42 PM  PHQ 2/9 Scores  PHQ - 2 Score 0 0 0 0 0 0 0  PHQ- 9 Score      0     Fall Risk    09/30/2022    5:58 PM 08/11/2022    2:25 PM 09/21/2021    4:07 PM 09/14/2021   11:47 AM 09/08/2020    9:09 AM  Fall Risk   Falls in the past year? 0 0 1 0 0  Comment   slipped down the stairs    Number falls in past yr: 0 0 1 0   Injury with Fall? 0 0 0 0   Risk for fall due to : Medication side effect No Fall Risks Medication side effect No Fall Risks Medication side effect  Follow up Falls prevention discussed;Education provided;Falls evaluation completed Falls evaluation completed Falls evaluation completed;Education provided;Falls prevention discussed Falls evaluation completed Falls evaluation completed;Education provided;Falls prevention discussed    FALL RISK PREVENTION PERTAINING TO THE HOME:  Any stairs in or around the home? Yes  If so, are there any without handrails? No  Home free of loose throw rugs in walkways, pet beds, electrical cords, etc? Yes  Adequate lighting in your home to reduce risk of falls? Yes   ASSISTIVE DEVICES UTILIZED TO PREVENT FALLS:  Life alert? No  Use of a cane, walker or w/c? No  Grab bars in the bathroom? Yes  Shower chair or bench in shower? No  Elevated toilet seat or a handicapped toilet? No   TIMED UP AND GO:  Was the test performed? Yes .  Length of time to ambulate 10 feet: 5 sec.   Gait steady and fast without use of assistive device  Cognitive Function:        10/04/2022   11:02 AM 09/21/2021    4:09 PM 09/08/2020    9:10 AM 08/27/2019     2:30 PM 08/15/2018    2:42 PM  6CIT Screen  What Year? 0 points 0 points 0 points 0 points 0 points  What month? 0 points 0 points 0 points 0 points 0 points  What time? 0 points 0 points 0 points 0 points 0 points  Count back from 20 0 points 0 points 0 points 0 points 0 points  Months in reverse 0 points 0 points 0 points 0 points 0 points  Repeat phrase 2 points 0 points 0 points 6 points 0 points  Total Score 2 points 0 points 0 points 6 points 0 points    Immunizations Immunization History  Administered Date(s) Administered   Covid-19, Mrna,Vaccine(Spikevax)58yrs and older 08/23/2022   Fluad Quad(high Dose 65+) 03/03/2020, 03/14/2021   Influenza-Unspecified 02/19/2019   PFIZER(Purple Top)SARS-COV-2 Vaccination 07/22/2019, 08/12/2019, 03/16/2020   Pfizer Covid-19 Vaccine Bivalent Booster 20yrs & up 05/20/2021   Pneumococcal Conjugate-13 03/10/2015   Pneumococcal Polysaccharide-23 04/19/2012, 10/04/2017   Pneumococcal-Unspecified 09/14/2011   Tdap 06/30/2009, 04/19/2012, 03/29/2020, 08/17/2020   Zoster Recombinat (Shingrix) 08/08/2018, 08/19/2018, 02/27/2019   Zoster, Live 09/08/2015    TDAP status: Up to date  Flu Vaccine status: Up to date  Pneumococcal vaccine status: Up to date  Covid-19 vaccine status: Completed vaccines  Qualifies for Shingles Vaccine? Yes   Zostavax completed Yes   Shingrix Completed?: Yes  Screening Tests Health Maintenance  Topic Date Due   Medicare Annual Wellness (AWV)  09/22/2022   INFLUENZA VACCINE  01/04/2023   COLONOSCOPY (Pts 45-98yrs Insurance coverage will need to be confirmed)  10/15/2023   DTaP/Tdap/Td (5 -  Td or Tdap) 08/18/2030   Pneumonia Vaccine 58+ Years old  Completed   COVID-19 Vaccine  Completed   Hepatitis C Screening  Completed   Zoster Vaccines- Shingrix  Completed   HPV VACCINES  Aged Out    Health Maintenance  Health Maintenance Due  Topic Date Due   Medicare Annual Wellness (AWV)  09/22/2022     Colorectal cancer screening: Type of screening: Colonoscopy. Completed 10/14/2020. Repeat every 3 years  Lung Cancer Screening: (Low Dose CT Chest recommended if Age 75-80 years, 30 pack-year currently smoking OR have quit w/in 15years.) does not qualify.   Lung Cancer Screening Referral: no  Additional Screening:  Hepatitis C Screening: does qualify; Completed 07/07/2021  Vision Screening: Recommended annual ophthalmology exams for early detection of glaucoma and other disorders of the eye. Is the patient up to date with their annual eye exam?  Yes  Who is the provider or what is the name of the office in which the patient attends [facility] eye exams? [facility] If pt is not established with a provider, would they like to be referred to a provider to establish care? No .   Dental Screening: Recommended annual dental exams for proper oral hygiene  Community Resource Referral / Chronic Care Management: CRR required this visit?  No   CCM required this visit?  No      Plan:     I have personally reviewed and noted the following in the patient's chart:   Medical and social history Use of alcohol, tobacco or illicit drugs  Current medications and supplements including opioid prescriptions. Patient is not currently taking opioid prescriptions. Functional ability and status Nutritional status Physical activity Advanced directives List of other physicians Hospitalizations, surgeries, and ER visits in previous 12 months Vitals Screenings to include cognitive, depression, and falls Referrals and appointments  In addition, I have reviewed and discussed with patient certain preventive protocols, quality metrics, and best practice recommendations. A written personalized care plan for preventive services as well as general preventive health recommendations were provided to patient.     Barb Merino, LPN   06/05/9145   Nurse Notes: none

## 2022-10-04 NOTE — Progress Notes (Signed)
I,Christopher Harvey,acting as a scribe for Christopher Aliment, MD.,have documented all relevant documentation on the behalf of Christopher Aliment, MD,as directed by  Christopher Aliment, MD while in the presence of Christopher Aliment, MD.    Subjective:     Patient ID: Christopher Harvey , male    DOB: 07-03-1945 , 77 y.o.   MRN: [id number]   Chief Complaint  Patient presents with   Hypertension    HPI  Patient is here for HTN follow up. He reports compliance with meds. He is also followed by [facility]. He adds that he recently had b/l ocuplasty for ptosis at the [facility]. This was performed Sat 09/23/22. He denies headache, chest pain, SOB.  AWV completed with [facility] Advisor: Christopher Harvey.   Hypertension This is a chronic problem. The current episode started more than 1 year ago. The problem has been gradually improving since onset. The problem is uncontrolled. Pertinent negatives include no blurred vision, chest pain, palpitations or shortness of breath. Past treatments include diuretics. The current treatment provides moderate improvement. Compliance problems include exercise.  Hypertensive end-organ damage includes kidney disease.  Back Pain Pertinent negatives include no chest pain.     Past Medical History:  Diagnosis Date   Cataract    Removed by [facility] [place] [facility]   Hypertension    PTSD (post-traumatic stress disorder)      Family History  Problem Relation Age of Onset   Hypertension Mother    Hypertension Father    Stroke Father      Current Outpatient Medications:    buPROPion (WELLBUTRIN XL) 150 MG 24 hr tablet, Take by mouth., Disp: , Rfl:    hydrochlorothiazide (HYDRODIURIL) 25 MG tablet, Take by mouth., Disp: , Rfl:    hydrOXYzine (ATARAX) 25 MG tablet, 25 mg., Disp: , Rfl:    loratadine (CLARITIN) 10 MG tablet, Take by mouth., Disp: , Rfl:    tamsulosin (FLOMAX) 0.4 MG CAPS capsule, Take 1 capsule (0.4 mg total) by mouth daily., Disp: 90 capsule, Rfl: 2    citalopram (CELEXA) 40 MG tablet, 1 tablet orally once a day, Disp: , Rfl:    tiZANidine (ZANAFLEX) 4 MG tablet, Take 1 tablet (4 mg total) by mouth daily. (Patient not taking: Reported on 10/04/2022), Disp: 30 tablet, Rfl: 1   No Known Allergies   Review of Systems  Constitutional: Negative.   Eyes:  Negative for blurred vision.  Respiratory: Negative.  Negative for shortness of breath.   Cardiovascular: Negative.  Negative for chest pain and palpitations.  Gastrointestinal: Negative.        He c/o lower LLQ  abdominal discomfort. This pain initially started Monday. He is unsure what could have triggered his sx. Thinks it could be an infection. Denies fever/chills. Denies n/v/d. He has not had similar sx in the past. He admits he does not always have a good bowel movement daily.     Genitourinary: Negative.   Musculoskeletal:  Positive for back pain.  Skin: Negative.   Allergic/Immunologic: Negative.   Neurological: Negative.   Hematological: Negative.      Today's Vitals   10/04/22 1108  BP: 116/64  Pulse: 97  Temp: 98.1 F (36.7 C)  SpO2: 94%  Weight: 252 lb (114.3 kg)  Height: 5\' 9"  (1.753 m)   Body mass index is 37.21 kg/m.  Wt Readings from Last 3 Encounters:  10/04/22 252 lb (114.3 kg)  10/04/22 252 lb 12.8 oz (114.7 kg)  08/11/22 256  lb 6.4 oz (116.3 kg)    Objective:  Physical Exam Vitals and nursing note reviewed.  Constitutional:      Appearance: Normal appearance. He is obese.  HENT:     Head: Normocephalic and atraumatic.  Eyes:     Extraocular Movements: Extraocular movements intact.  Cardiovascular:     Rate and Rhythm: Normal rate and regular rhythm.     Heart sounds: Normal heart sounds.  Pulmonary:     Effort: Pulmonary effort is normal.     Breath sounds: Normal breath sounds.  Abdominal:     General: Bowel sounds are normal.     Palpations: Abdomen is soft.     Comments: Obese, difficult to assess organomegaly. No rebound, no guarding   Musculoskeletal:     Cervical back: Normal range of motion.  Skin:    General: Skin is warm.  Neurological:     General: No focal deficit present.     Mental Status: He is alert.  Psychiatric:        Mood and Affect: Mood normal.       Assessment And Plan:     1. Parenchymal renal hypertension, stage 1 through stage 4 or unspecified chronic kidney disease Comments: Chronic, well controlled. He will c/w HCTZ daily, encouraged to follow low sodium diet. March 2024 labs reviewed in detail. He will f/u in 4 months.  2. Chronic renal disease, stage II Comments: Chronic, he is reminded to avoid NSAIDs and encouraged to stay well hydrated to decrease risk of CKD progression.  3. LLQ pain Comments: Possibly due to constipation. He agrees to try OTC MIralax daily prn. He will let me know if his sx persist.  4. Spinal stenosis of lumbar region with neurogenic claudication Comments: Chronic, he is now managed by the [place]. He has completed physical therapy.  5. Class 2 severe obesity due to excess calories with serious comorbidity and body mass index (BMI) of 37.0 to 37.9 in adult [facility]) Comments: He is encouraged to aim for at least 150 minutes of exercise/week, while striving for BMI<30 to decrease cardiac risk.  Patient was given opportunity to ask questions. Patient verbalized understanding of the plan and was able to repeat key elements of the plan. All questions were answered to their satisfaction.   I, Christopher Aliment, MD, have reviewed all documentation for this visit. The documentation on 10/04/22 for the exam, diagnosis, procedures, and orders are all accurate and complete.   IF YOU HAVE BEEN REFERRED TO A SPECIALIST, IT MAY TAKE 1-2 WEEKS TO SCHEDULE/PROCESS THE REFERRAL. IF YOU HAVE NOT HEARD FROM US/SPECIALIST IN TWO WEEKS, PLEASE GIVE [place] A CALL AT [phone] X [phone].   THE PATIENT IS ENCOURAGED TO PRACTICE SOCIAL DISTANCING DUE TO THE COVID-19 PANDEMIC.

## 2022-10-04 NOTE — Patient Instructions (Signed)
Christopher Harvey , Thank you for taking time to come for your Medicare Wellness Visit. I appreciate your ongoing commitment to your health goals. Please review the following plan we discussed and let me know if I can assist you in the future.   These are the goals we discussed:  Goals       Patient Stated (pt-stated)      To make it to the end of the year      Patient Stated      08/27/2019, to stay healthy      Patient Stated      09/08/2020, stay healthy      Patient Stated      09/21/2021, wants to get down to 225 pounds      Patient Stated      10/04/2022, wantsto lose weight        This is a list of the screening recommended for you and due dates:  Health Maintenance  Topic Date Due   Flu Shot  01/04/2023   Medicare Annual Wellness Visit  10/04/2023   Colon Cancer Screening  10/15/2023   DTaP/Tdap/Td vaccine (5 - Td or Tdap) 08/18/2030   Pneumonia Vaccine  Completed   COVID-19 Vaccine  Completed   Hepatitis C Screening: USPSTF Recommendation to screen - Ages 8-79 yo.  Completed   Zoster (Shingles) Vaccine  Completed   HPV Vaccine  Aged Out    Advanced directives: Please bring a copy of your POA (Power of [place]) and/or Living Will to your next appointment.   Conditions/risks identified: none  Next appointment: Follow up in one year for your annual wellness visit.   Preventive Care 16 Years and Older, Male  Preventive care refers to lifestyle choices and visits with your health care provider that can promote health and wellness. What does preventive care include? A yearly physical exam. This is also called an annual well check. Dental exams once or twice a year. Routine eye exams. Ask your health care provider how often you should have your eyes checked. Personal lifestyle choices, including: Daily care of your teeth and gums. Regular physical activity. Eating a healthy diet. Avoiding tobacco and drug use. Limiting alcohol use. Practicing safe sex. Taking low  doses of aspirin every day. Taking vitamin and mineral supplements as recommended by your health care provider. What happens during an annual well check? The services and screenings done by your health care provider during your annual well check will depend on your age, overall health, lifestyle risk factors, and family history of disease. Counseling  Your health care provider may ask you questions about your: Alcohol use. Tobacco use. Drug use. Emotional well-being. Home and relationship well-being. Sexual activity. Eating habits. History of falls. Memory and ability to understand (cognition). Work and work Astronomer. Screening  You may have the following tests or measurements: Height, weight, and BMI. Blood pressure. Lipid and cholesterol levels. These may be checked every 5 years, or more frequently if you are over 80 years old. Skin check. Lung cancer screening. You may have this screening every year starting at age 49 if you have a 30-pack-year history of smoking and currently smoke or have quit within the past 15 years. Fecal occult blood test (FOBT) of the stool. You may have this test every year starting at age 5. Flexible sigmoidoscopy or colonoscopy. You may have a sigmoidoscopy every 5 years or a colonoscopy every 10 years starting at age 76. Prostate cancer screening. Recommendations will vary depending on your  family history and other risks. Hepatitis C blood test. Hepatitis B blood test. Sexually transmitted disease (STD) testing. Diabetes screening. This is done by checking your blood sugar (glucose) after you have not eaten for a while (fasting). You may have this done every 1-3 years. Abdominal aortic aneurysm (AAA) screening. You may need this if you are a current or former smoker. Osteoporosis. You may be screened starting at age 55 if you are at high risk. Talk with your health care provider about your test results, treatment options, and if necessary, the need  for more tests. Vaccines  Your health care provider may recommend certain vaccines, such as: Influenza vaccine. This is recommended every year. Tetanus, diphtheria, and acellular pertussis (Tdap, Td) vaccine. You may need a Td booster every 10 years. Zoster vaccine. You may need this after age 39. Pneumococcal 13-valent conjugate (PCV13) vaccine. One dose is recommended after age 61. Pneumococcal polysaccharide (PPSV23) vaccine. One dose is recommended after age 83. Talk to your health care provider about which screenings and vaccines you need and how often you need them. This information is not intended to replace advice given to you by your health care provider. Make sure you discuss any questions you have with your health care provider. Document Released: 06/18/2015 Document Revised: 02/09/2016 Document Reviewed: 03/23/2015 Elsevier Interactive Patient Education  2017 [employer].  Fall Prevention in the Home Falls can cause injuries. They can happen to people of all ages. There are many things you can do to make your home safe and to help prevent falls. What can I do on the outside of my home? Regularly fix the edges of walkways and driveways and fix any cracks. Remove anything that might make you trip as you walk through a door, such as a raised step or threshold. Trim any bushes or trees on the path to your home. Use bright outdoor lighting. Clear any walking paths of anything that might make someone trip, such as rocks or tools. Regularly check to see if handrails are loose or broken. Make sure that both sides of any steps have handrails. Any raised decks and porches should have guardrails on the edges. Have any leaves, snow, or ice cleared regularly. Use sand or salt on walking paths during winter. Clean up any spills in your garage right away. This includes oil or grease spills. What can I do in the bathroom? Use night lights. Install grab bars by the toilet and in the tub and  shower. Do not use towel bars as grab bars. Use non-skid mats or decals in the tub or shower. If you need to sit down in the shower, use a plastic, non-slip stool. Keep the floor dry. Clean up any water that spills on the floor as soon as it happens. Remove soap buildup in the tub or shower regularly. Attach bath mats securely with double-sided non-slip rug tape. Do not have throw rugs and other things on the floor that can make you trip. What can I do in the bedroom? Use night lights. Make sure that you have a light by your bed that is easy to reach. Do not use any sheets or blankets that are too big for your bed. They should not hang down onto the floor. Have a firm chair that has side arms. You can use this for support while you get dressed. Do not have throw rugs and other things on the floor that can make you trip. What can I do in the kitchen? Clean up  any spills right away. Avoid walking on wet floors. Keep items that you use a lot in easy-to-reach places. If you need to reach something above you, use a strong step stool that has a grab bar. Keep electrical cords out of the way. Do not use floor polish or wax that makes floors slippery. If you must use wax, use non-skid floor wax. Do not have throw rugs and other things on the floor that can make you trip. What can I do with my stairs? Do not leave any items on the stairs. Make sure that there are handrails on both sides of the stairs and use them. Fix handrails that are broken or loose. Make sure that handrails are as long as the stairways. Check any carpeting to make sure that it is firmly attached to the stairs. Fix any carpet that is loose or worn. Avoid having throw rugs at the top or bottom of the stairs. If you do have throw rugs, attach them to the floor with carpet tape. Make sure that you have a light switch at the top of the stairs and the bottom of the stairs. If you do not have them, ask someone to add them for  you. What else can I do to help prevent falls? Wear shoes that: Do not have high heels. Have rubber bottoms. Are comfortable and fit you well. Are closed at the toe. Do not wear sandals. If you use a stepladder: Make sure that it is fully opened. Do not climb a closed stepladder. Make sure that both sides of the stepladder are locked into place. Ask someone to hold it for you, if possible. Clearly mark and make sure that you can see: Any grab bars or handrails. First and last steps. Where the edge of each step is. Use tools that help you move around (mobility aids) if they are needed. These include: Canes. Walkers. Scooters. Crutches. Turn on the lights when you go into a dark area. Replace any light bulbs as soon as they burn out. Set up your furniture so you have a clear path. Avoid moving your furniture around. If any of your floors are uneven, fix them. If there are any pets around you, be aware of where they are. Review your medicines with your doctor. Some medicines can make you feel dizzy. This can increase your chance of falling. Ask your doctor what other things that you can do to help prevent falls. This information is not intended to replace advice given to you by your health care provider. Make sure you discuss any questions you have with your health care provider. Document Released: 03/18/2009 Document Revised: 10/28/2015 Document Reviewed: 06/26/2014 Elsevier Interactive Patient Education  2017 [employer].

## 2022-10-04 NOTE — Patient Instructions (Addendum)
Constipation, Adult Constipation is when a person has trouble pooping (having a bowel movement). When you have this condition, you may poop fewer than 3 times a week. Your poop (stool) may also be dry, hard, or bigger than normal. Follow these instructions at home: Eating and drinking  Eat foods that have a lot of fiber, such as: Fresh fruits and vegetables. Whole grains. Beans. Eat less of foods that are low in fiber and high in fat and sugar, such as: [place] fries. Hamburgers. Cookies. Candy. Soda. Drink enough fluid to keep your pee (urine) pale yellow. General instructions Exercise regularly or as told by your doctor. Try to do 150 minutes of exercise each week. Go to the restroom when you feel like you need to poop. Do not hold it in. Take over-the-counter and prescription medicines only as told by your doctor. These include any fiber supplements. When you poop: Do deep breathing while relaxing your lower belly (abdomen). Relax your pelvic floor. The pelvic floor is a group of muscles that support the rectum, bladder, and intestines (as well as the uterus in women). Watch your condition for any changes. Tell your doctor if you notice any. Keep all follow-up visits as told by your doctor. This is important. Contact a doctor if: You have pain that gets worse. You have a fever. You have not pooped for 4 days. You vomit. You are not hungry. You lose weight. You are bleeding from the opening of the butt (anus). You have thin, pencil-like poop. Get help right away if: You have a fever, and your symptoms suddenly get worse. You leak poop or have blood in your poop. Your belly feels hard or bigger than normal (bloated). You have very bad belly pain. You feel dizzy or you faint. Summary Constipation is when a person poops fewer than 3 times a week, has trouble pooping, or has poop that is dry, hard, or bigger than normal. Eat foods that have a lot of fiber. Drink enough fluid  to keep your pee (urine) pale yellow. Take over-the-counter and prescription medicines only as told by your doctor. These include any fiber supplements. This information is not intended to replace advice given to you by your health care provider. Make sure you discuss any questions you have with your health care provider. Document Revised: 04/05/2022 Document Reviewed: 04/05/2022 Elsevier Patient Education  2023 [employer]   Hypertension, Adult Hypertension is another name for high blood pressure. High blood pressure forces your heart to work harder to pump blood. This can cause problems over time. There are two numbers in a blood pressure reading. There is a top number (systolic) over a bottom number (diastolic). It is best to have a blood pressure that is below 120/80. What are the causes? The cause of this condition is not known. Some other conditions can lead to high blood pressure. What increases the risk? Some lifestyle factors can make you more likely to develop high blood pressure: Smoking. Not getting enough exercise or physical activity. Being overweight. Having too much fat, sugar, calories, or salt (sodium) in your diet. Drinking too much alcohol. Other risk factors include: Having any of these conditions: Heart disease. Diabetes. High cholesterol. Kidney disease. Obstructive sleep apnea. Having a family history of high blood pressure and high cholesterol. Age. The risk increases with age. Stress. What are the signs or symptoms? High blood pressure may not cause symptoms. Very high blood pressure (hypertensive crisis) may cause: Headache. Fast or uneven heartbeats (palpitations). Shortness of  breath. Nosebleed. Vomiting or feeling like you may vomit (nauseous). Changes in how you see. Very bad chest pain. Feeling dizzy. Seizures. How is this treated? This condition is treated by making healthy lifestyle changes, such as: Eating healthy foods. Exercising  more. Drinking less alcohol. Your doctor may prescribe medicine if lifestyle changes do not help enough and if: Your top number is above 130. Your bottom number is above 80. Your personal target blood pressure may vary. Follow these instructions at home: Eating and drinking  If told, follow the DASH eating plan. To follow this plan: Fill one half of your plate at each meal with fruits and vegetables. Fill one fourth of your plate at each meal with whole grains. Whole grains include whole-wheat pasta, brown rice, and whole-grain bread. Eat or drink low-fat dairy products, such as skim milk or low-fat yogurt. Fill one fourth of your plate at each meal with low-fat (lean) proteins. Low-fat proteins include fish, chicken without skin, eggs, beans, and tofu. Avoid fatty meat, cured and processed meat, or chicken with skin. Avoid pre-made or processed food. Limit the amount of salt in your diet to less than 1,500 mg each day. Do not drink alcohol if: Your doctor tells you not to drink. You are pregnant, may be pregnant, or are planning to become pregnant. If you drink alcohol: Limit how much you have to: 0-1 drink a day for women. 0-2 drinks a day for men. Know how much alcohol is in your drink. In the [place] one drink equals one 12 oz bottle of beer (355 mL), one 5 oz glass of wine (148 mL), or one 1 oz glass of hard liquor (44 mL). Lifestyle  Work with your doctor to stay at a healthy weight or to lose weight. Ask your doctor what the best weight is for you. Get at least 30 minutes of exercise that causes your heart to beat faster (aerobic exercise) most days of the week. This may include walking, swimming, or biking. Get at least 30 minutes of exercise that strengthens your muscles (resistance exercise) at least 3 days a week. This may include lifting weights or doing Pilates. Do not smoke or use any products that contain nicotine or tobacco. If you need help quitting, ask your  doctor. Check your blood pressure at home as told by your doctor. Keep all follow-up visits. Medicines Take over-the-counter and prescription medicines only as told by your doctor. Follow directions carefully. Do not skip doses of blood pressure medicine. The medicine does not work as well if you skip doses. Skipping doses also puts you at risk for problems. Ask your doctor about side effects or reactions to medicines that you should watch for. Contact a doctor if: You think you are having a reaction to the medicine you are taking. You have headaches that keep coming back. You feel dizzy. You have swelling in your ankles. You have trouble with your vision. Get help right away if: You get a very bad headache. You start to feel mixed up (confused). You feel weak or numb. You feel faint. You have very bad pain in your: Chest. Belly (abdomen). You vomit more than once. You have trouble breathing. These symptoms may be an emergency. Get help right away. Call 911. Do not wait to see if the symptoms will go away. Do not drive yourself to the hospital. Summary Hypertension is another name for high blood pressure. High blood pressure forces your heart to work harder to pump blood. For most  people, a normal blood pressure is less than 120/80. Making healthy choices can help lower blood pressure. If your blood pressure does not get lower with healthy choices, you may need to take medicine. This information is not intended to replace advice given to you by your health care provider. Make sure you discuss any questions you have with your health care provider. Document Revised: 03/10/2021 Document Reviewed: 03/10/2021 Elsevier Patient Education  2023 [employer].

## 2023-02-09 NOTE — Patient Instructions (Signed)
Hypertension, Adult High blood pressure (hypertension) is when the force of blood pumping through the arteries is too strong. The arteries are the blood vessels that carry blood from the heart throughout the body. Hypertension forces the heart to work harder to pump blood and may cause arteries to become narrow or stiff. Untreated or uncontrolled hypertension can lead to a heart attack, heart failure, a stroke, kidney disease, and other problems. A blood pressure reading consists of a higher number over a lower number. Ideally, your blood pressure should be below 120/80. The first ("top") number is called the systolic pressure. It is a measure of the pressure in your arteries as your heart beats. The second ("bottom") number is called the diastolic pressure. It is a measure of the pressure in your arteries as the heart relaxes. What are the causes? The exact cause of this condition is not known. There are some conditions that result in high blood pressure. What increases the risk? Certain factors may make you more likely to develop high blood pressure. Some of these risk factors are under your control, including: Smoking. Not getting enough exercise or physical activity. Being overweight. Having too much fat, sugar, calories, or salt (sodium) in your diet. Drinking too much alcohol. Other risk factors include: Having a personal history of heart disease, diabetes, high cholesterol, or kidney disease. Stress. Having a family history of high blood pressure and high cholesterol. Having obstructive sleep apnea. Age. The risk increases with age. What are the signs or symptoms? High blood pressure may not cause symptoms. Very high blood pressure (hypertensive crisis) may cause: Headache. Fast or irregular heartbeats (palpitations). Shortness of breath. Nosebleed. Nausea and vomiting. Vision changes. Severe chest pain, dizziness, and seizures. How is this diagnosed? This condition is diagnosed by  measuring your blood pressure while you are seated, with your arm resting on a flat surface, your legs uncrossed, and your feet flat on the floor. The cuff of the blood pressure monitor will be placed directly against the skin of your upper arm at the level of your heart. Blood pressure should be measured at least twice using the same arm. Certain conditions can cause a difference in blood pressure between your right and left arms. If you have a high blood pressure reading during one visit or you have normal blood pressure with other risk factors, you may be asked to: Return on a different day to have your blood pressure checked again. Monitor your blood pressure at home for 1 week or longer. If you are diagnosed with hypertension, you may have other blood or imaging tests to help your health care provider understand your overall risk for other conditions. How is this treated? This condition is treated by making healthy lifestyle changes, such as eating healthy foods, exercising more, and reducing your alcohol intake. You may be referred for counseling on a healthy diet and physical activity. Your health care provider may prescribe medicine if lifestyle changes are not enough to get your blood pressure under control and if: Your systolic blood pressure is above 130. Your diastolic blood pressure is above 80. Your personal target blood pressure may vary depending on your medical conditions, your age, and other factors. Follow these instructions at home: Eating and drinking  Eat a diet that is high in fiber and potassium, and low in sodium, added sugar, and fat. An example of this eating plan is called the DASH diet. DASH stands for Dietary Approaches to Stop Hypertension. To eat this way: Eat   plenty of fresh fruits and vegetables. Try to fill one half of your plate at each meal with fruits and vegetables. Eat whole grains, such as whole-wheat pasta, brown rice, or whole-grain bread. Fill about one  fourth of your plate with whole grains. Eat or drink low-fat dairy products, such as skim milk or low-fat yogurt. Avoid fatty cuts of meat, processed or cured meats, and poultry with skin. Fill about one fourth of your plate with lean proteins, such as fish, chicken without skin, beans, eggs, or tofu. Avoid pre-made and processed foods. These tend to be higher in sodium, added sugar, and fat. Reduce your daily sodium intake. Many people with hypertension should eat less than 1,500 mg of sodium a day. Do not drink alcohol if: Your health care provider tells you not to drink. You are pregnant, may be pregnant, or are planning to become pregnant. If you drink alcohol: Limit how much you have to: 0-1 drink a day for women. 0-2 drinks a day for men. Know how much alcohol is in your drink. In the [place] one drink equals one 12 oz bottle of beer (355 mL), one 5 oz glass of wine (148 mL), or one 1 oz glass of hard liquor (44 mL). Lifestyle  Work with your health care provider to maintain a healthy body weight or to lose weight. Ask what an ideal weight is for you. Get at least 30 minutes of exercise that causes your heart to beat faster (aerobic exercise) most days of the week. Activities may include walking, swimming, or biking. Include exercise to strengthen your muscles (resistance exercise), such as Pilates or lifting weights, as part of your weekly exercise routine. Try to do these types of exercises for 30 minutes at least 3 days a week. Do not use any products that contain nicotine or tobacco. These products include cigarettes, chewing tobacco, and vaping devices, such as e-cigarettes. If you need help quitting, ask your health care provider. Monitor your blood pressure at home as told by your health care provider. Keep all follow-up visits. This is important. Medicines Take over-the-counter and prescription medicines only as told by your health care provider. Follow directions carefully. Blood  pressure medicines must be taken as prescribed. Do not skip doses of blood pressure medicine. Doing this puts you at risk for problems and can make the medicine less effective. Ask your health care provider about side effects or reactions to medicines that you should watch for. Contact a health care provider if you: Think you are having a reaction to a medicine you are taking. Have headaches that keep coming back (recurring). Feel dizzy. Have swelling in your ankles. Have trouble with your vision. Get help right away if you: Develop a severe headache or confusion. Have unusual weakness or numbness. Feel faint. Have severe pain in your chest or abdomen. Vomit repeatedly. Have trouble breathing. These symptoms may be an emergency. Get help right away. Call 911. Do not wait to see if the symptoms will go away. Do not drive yourself to the hospital. Summary Hypertension is when the force of blood pumping through your arteries is too strong. If this condition is not controlled, it may put you at risk for serious complications. Your personal target blood pressure may vary depending on your medical conditions, your age, and other factors. For most people, a normal blood pressure is less than 120/80. Hypertension is treated with lifestyle changes, medicines, or a combination of both. Lifestyle changes include losing weight, eating a healthy,   low-sodium diet, exercising more, and limiting alcohol. This information is not intended to replace advice given to you by your health care provider. Make sure you discuss any questions you have with your health care provider. Document Revised: 03/29/2021 Document Reviewed: 03/29/2021 Elsevier Patient Education  2024 Elsevier Inc.  

## 2023-02-12 ENCOUNTER — Encounter: Payer: Self-pay | Admitting: Internal Medicine

## 2023-02-12 ENCOUNTER — Ambulatory Visit (INDEPENDENT_AMBULATORY_CARE_PROVIDER_SITE_OTHER): Payer: Medicare Other | Admitting: Internal Medicine

## 2023-02-12 VITALS — BP 130/82 | HR 88 | Temp 98.6°F | Ht 69.0 in | Wt 248.0 lb

## 2023-02-12 DIAGNOSIS — N182 Chronic kidney disease, stage 2 (mild): Secondary | ICD-10-CM | POA: Diagnosis not present

## 2023-02-12 DIAGNOSIS — M48062 Spinal stenosis, lumbar region with neurogenic claudication: Secondary | ICD-10-CM | POA: Diagnosis not present

## 2023-02-12 DIAGNOSIS — R2689 Other abnormalities of gait and mobility: Secondary | ICD-10-CM | POA: Diagnosis not present

## 2023-02-12 DIAGNOSIS — R7309 Other abnormal glucose: Secondary | ICD-10-CM

## 2023-02-12 DIAGNOSIS — R251 Tremor, unspecified: Secondary | ICD-10-CM | POA: Insufficient documentation

## 2023-02-12 DIAGNOSIS — I129 Hypertensive chronic kidney disease with stage 1 through stage 4 chronic kidney disease, or unspecified chronic kidney disease: Secondary | ICD-10-CM | POA: Diagnosis not present

## 2023-02-12 DIAGNOSIS — Z6836 Body mass index (BMI) 36.0-36.9, adult: Secondary | ICD-10-CM

## 2023-02-12 MED ORDER — CYANOCOBALAMIN 1000 MCG/ML IJ SOLN
1000.0000 ug | Freq: Once | INTRAMUSCULAR | Status: AC
Start: 2023-02-12 — End: 2023-02-12
  Administered 2023-02-12: 1000 ug via INTRAMUSCULAR

## 2023-02-12 NOTE — Progress Notes (Signed)
I,Victoria T Deloria Lair, CMA,acting as a Neurosurgeon for Gwynneth Aliment, MD.,have documented all relevant documentation on the behalf of Gwynneth Aliment, MD,as directed by  Gwynneth Aliment, MD while in the presence of Gwynneth Aliment, MD.  Subjective:  Patient ID: Christopher Harvey , male    DOB: 02-Nov-1945 , 77 y.o.   MRN: [id number]  Chief Complaint  Patient presents with   Hypertension    HPI  Patient is here for HTN follow up. He reports compliance with meds. He is also followed by [facility]. Denies headache, chest pain, and SOB.  He reports receiving flu shot at the [place]. He does not remember the date received.   Hypertension This is a chronic problem. The current episode started more than 1 year ago. The problem has been gradually improving since onset. The problem is uncontrolled. Pertinent negatives include no blurred vision, chest pain, palpitations or shortness of breath. Past treatments include diuretics. The current treatment provides moderate improvement. Compliance problems include exercise.  Hypertensive end-organ damage includes kidney disease.  Back Pain This is a recurrent problem. The problem is unchanged. The pain is present in the gluteal. The quality of the pain is described as aching. The pain is moderate. Pertinent negatives include no chest pain.     Past Medical History:  Diagnosis Date   Cataract    Removed by [facility] [place] [facility]   Hypertension    PTSD (post-traumatic stress disorder)      Family History  Problem Relation Age of Onset   Hypertension Mother    Hypertension Father    Stroke Father      Current Outpatient Medications:    buPROPion (WELLBUTRIN XL) 150 MG 24 hr tablet, Take by mouth., Disp: , Rfl:    hydrochlorothiazide (HYDRODIURIL) 25 MG tablet, Take by mouth., Disp: , Rfl:    hydrOXYzine (ATARAX) 25 MG tablet, 25 mg., Disp: , Rfl:    loratadine (CLARITIN) 10 MG tablet, Take by mouth., Disp: , Rfl:    tamsulosin (FLOMAX) 0.4 MG CAPS  capsule, Take 1 capsule (0.4 mg total) by mouth daily., Disp: 90 capsule, Rfl: 2   tiZANidine (ZANAFLEX) 4 MG tablet, Take 1 tablet (4 mg total) by mouth daily. (Patient not taking: Reported on 10/04/2022), Disp: 30 tablet, Rfl: 1   No Known Allergies   Review of Systems  Constitutional: Negative.   HENT: Negative.    Eyes:  Negative for blurred vision.  Respiratory: Negative.  Negative for shortness of breath.   Cardiovascular: Negative.  Negative for chest pain and palpitations.  Endocrine: Negative.   Musculoskeletal:  Positive for back pain.       He would like to be referred to orthopedics for this issue for pain management. He wants to be evaluated for steroid shots as treatment.   Skin: Negative.   Allergic/Immunologic: Negative.   Neurological:  Positive for tremors.       He also reports noticing he has a shaky right hand, he notices when he tries to write or reach for something.   Hematological: Negative.      Today's Vitals   02/12/23 1450  BP: 130/82  Pulse: 88  Temp: 98.6 F (37 C)  SpO2: 98%  Weight: 248 lb (112.5 kg)  Height: 5\' 9"  (1.753 m)   Body mass index is 36.62 kg/m.  Wt Readings from Last 3 Encounters:  02/12/23 248 lb (112.5 kg)  10/04/22 252 lb (114.3 kg)  10/04/22 252 lb 12.8 oz (114.7  kg)     Objective:  Physical Exam Vitals and nursing note reviewed.  Constitutional:      Appearance: Normal appearance. He is obese.  HENT:     Head: Normocephalic and atraumatic.  Eyes:     Extraocular Movements: Extraocular movements intact.  Cardiovascular:     Rate and Rhythm: Normal rate and regular rhythm.     Heart sounds: Normal heart sounds.  Pulmonary:     Effort: Pulmonary effort is normal.     Breath sounds: Normal breath sounds.  Musculoskeletal:     Cervical back: Normal range of motion.  Skin:    General: Skin is warm.  Neurological:     General: No focal deficit present.     Mental Status: He is alert.     Comments: UE tremor R>L   Psychiatric:        Mood and Affect: Mood normal.         Assessment And Plan:  Parenchymal renal hypertension, stage 1 through stage 4 or unspecified chronic kidney disease Assessment & Plan: Chronic, fair control. Goal BP<120/80.  He is also managed at the [place]. He will continue with hydrochlorothiazide 25mg  daily. I do think he would benefit from amlodipine 2.5mg  nightly; however, he does not wish to add to his current regimen.   Orders: -     CMP14+EGFR  Chronic renal disease, stage II Assessment & Plan: Chronic, he is encouraged to keep BP well controlled and to stay well hydrated to decrease risk of CKD progression.     Spinal stenosis of lumbar region with neurogenic claudication -     Ambulatory referral to Orthopedic Surgery  Tremor of right hand Assessment & Plan: I wil check labs as below. He does not wish to have Neurology evaluation at this time. He is encouraged to avoid caffeinated beverages.  Orders: -     TSH  Imbalance Assessment & Plan: I will check vitamin B12 level today. He was also given vitamin B12 IM x 1. He will let me know if his symptoms improve.   Orders: -     TSH -     Vitamin B12 -     Cyanocobalamin  Other abnormal glucose Assessment & Plan: Previous labs reviewed, her A1c has been elevated in the past. I will check an A1c today. Reminded to avoid refined sugars including sugary drinks/foods and processed meats including bacon, sausages and deli meats.    Orders: -     CMP14+EGFR -     Hemoglobin A1c  Class 2 severe obesity due to excess calories with serious comorbidity and body mass index (BMI) of 36.0 to 36.9 in adult [facility]) Assessment & Plan: He is encouraged to initially strive for BMI less than 30 to decrease cardiac risk. He is advised to exercise no less than 150 minutes per week.     He is encouraged to strive for BMI less than 30 to decrease cardiac risk. Advised to aim for at least 150 minutes of exercise per week.     Return if symptoms worsen or fail to improve.  Patient was given opportunity to ask questions. Patient verbalized understanding of the plan and was able to repeat key elements of the plan. All questions were answered to their satisfaction.   I, Gwynneth Aliment, MD, have reviewed all documentation for this visit. The documentation on 02/22/23 for the exam, diagnosis, procedures, and orders are all accurate and complete.   IF YOU HAVE BEEN REFERRED TO A  SPECIALIST, IT MAY TAKE 1-2 WEEKS TO SCHEDULE/PROCESS THE REFERRAL. IF YOU HAVE NOT HEARD FROM US/SPECIALIST IN TWO WEEKS, PLEASE GIVE [place] A CALL AT [phone].   THE PATIENT IS ENCOURAGED TO PRACTICE SOCIAL DISTANCING DUE TO THE COVID-19 PANDEMIC.

## 2023-02-13 LAB — TSH: TSH: 1.97 u[IU]/mL (ref 0.450–4.500)

## 2023-02-13 LAB — CMP14+EGFR
ALT: 25 IU/L (ref 0–44)
AST: 18 IU/L (ref 0–40)
Albumin: 4.3 g/dL (ref 3.8–4.8)
Alkaline Phosphatase: 68 IU/L (ref 44–121)
BUN/Creatinine Ratio: 10 (ref 10–24)
BUN: 11 mg/dL (ref 8–27)
Bilirubin Total: 0.5 mg/dL (ref 0.0–1.2)
CO2: 24 mmol/L (ref 20–29)
Calcium: 9.6 mg/dL (ref 8.6–10.2)
Chloride: 102 mmol/L (ref 96–106)
Creatinine, Ser: 1.11 mg/dL (ref 0.76–1.27)
Globulin, Total: 2.9 g/dL (ref 1.5–4.5)
Glucose: 90 mg/dL (ref 70–99)
Potassium: 4.4 mmol/L (ref 3.5–5.2)
Sodium: 142 mmol/L (ref 134–144)
Total Protein: 7.2 g/dL (ref 6.0–8.5)
eGFR: 69 mL/min/{1.73_m2} (ref >59)

## 2023-02-13 LAB — VITAMIN B12: Vitamin B-12: 511 pg/mL (ref 232–1245)

## 2023-02-13 LAB — HEMOGLOBIN A1C
Est. average glucose Bld gHb Est-mCnc: 111 mg/dL
Hgb A1c MFr Bld: 5.5 % (ref 4.8–5.6)

## 2023-02-22 DIAGNOSIS — M48062 Spinal stenosis, lumbar region with neurogenic claudication: Secondary | ICD-10-CM | POA: Insufficient documentation

## 2023-02-22 NOTE — Assessment & Plan Note (Signed)
Previous labs reviewed, her A1c has been elevated in the past. I will check an A1c today. Reminded to avoid refined sugars including sugary drinks/foods and processed meats including bacon, sausages and deli meats.

## 2023-02-22 NOTE — Assessment & Plan Note (Signed)
I will check vitamin B12 level today. He was also given vitamin B12 IM x 1. He will let me know if his symptoms improve.

## 2023-02-22 NOTE — Assessment & Plan Note (Signed)
I wil check labs as below. He does not wish to have Neurology evaluation at this time. He is encouraged to avoid caffeinated beverages.

## 2023-02-22 NOTE — Assessment & Plan Note (Signed)
Chronic, he is encouraged to keep BP well controlled and to stay well hydrated to decrease risk of CKD progression.

## 2023-02-22 NOTE — Assessment & Plan Note (Signed)
He is encouraged to initially strive for BMI less than 30 to decrease cardiac risk. He is advised to exercise no less than 150 minutes per week.

## 2023-02-22 NOTE — Assessment & Plan Note (Signed)
Chronic, fair control. Goal BP<120/80.  He is also managed at the [place]. He will continue with hydrochlorothiazide 25mg  daily. I do think he would benefit from amlodipine 2.5mg  nightly; however, he does not wish to add to his current regimen.

## 2023-03-01 ENCOUNTER — Ambulatory Visit (INDEPENDENT_AMBULATORY_CARE_PROVIDER_SITE_OTHER): Payer: Medicare Other | Admitting: Family Medicine

## 2023-03-01 ENCOUNTER — Encounter: Payer: Self-pay | Admitting: Family Medicine

## 2023-03-01 VITALS — BP 126/80 | HR 94 | Temp 98.7°F | Ht 69.0 in | Wt 246.2 lb

## 2023-03-01 DIAGNOSIS — Z1211 Encounter for screening for malignant neoplasm of colon: Secondary | ICD-10-CM

## 2023-03-01 DIAGNOSIS — R0981 Nasal congestion: Secondary | ICD-10-CM | POA: Diagnosis not present

## 2023-03-01 DIAGNOSIS — K921 Melena: Secondary | ICD-10-CM

## 2023-03-01 DIAGNOSIS — E66812 Obesity, class 2: Secondary | ICD-10-CM

## 2023-03-01 DIAGNOSIS — R0982 Postnasal drip: Secondary | ICD-10-CM | POA: Diagnosis not present

## 2023-03-01 DIAGNOSIS — Z6836 Body mass index (BMI) 36.0-36.9, adult: Secondary | ICD-10-CM

## 2023-03-01 LAB — POC HEMOCCULT BLD/STL (OFFICE/1-CARD/DIAGNOSTIC): Fecal Occult Blood, POC: POSITIVE — AB

## 2023-03-01 MED ORDER — FLUTICASONE PROPIONATE 50 MCG/ACT NA SUSP
2.0000 | Freq: Two times a day (BID) | NASAL | 2 refills | Status: DC | PRN
Start: 2023-03-01 — End: 2024-01-09

## 2023-03-01 MED ORDER — GUAIFENESIN ER 600 MG PO TB12
600.0000 mg | ORAL_TABLET | Freq: Two times a day (BID) | ORAL | 2 refills | Status: DC
Start: 2023-03-01 — End: 2024-01-09

## 2023-03-01 NOTE — Progress Notes (Signed)
I,Victoria T Deloria Lair, CMA,acting as a Neurosurgeon for [employer], NP.,have documented all relevant documentation on the behalf of Ellender Hose, NP,as directed by  Ellender Hose, NP while in the presence of Ellender Hose, NP.  Subjective:  Patient ID: Christopher Harvey , male    DOB: [birth year] , 77 y.o.   MRN: [id number]  Chief Complaint  Patient presents with   Sore Throat    HPI  Patient presents today for postnasal drip that initially started 2 weeks ago Monday. Patient states he has done 2 COVID 19 test & strep test  on 02/21/2023 at the [facility] which  came back negative. He was prescribed throat spray & Tessalon Perles He reports now his wife is also going through the same issue.  He admits not yet feeling relieved of his sore throat. Post nasal drip Denies fever, headache, fatigue or body aches He also reports that he started noticing blood in his stool after taking Tessalon Perles. This morning was his most recent Bowel Movement. Patient states he is scheduled for a colonoscopy next year.        Past Medical History:  Diagnosis Date   Cataract    Removed by [facility] [place] [facility]   Encounter for screening fecal occult blood testing 03/05/2023   Hypertension    PTSD (post-traumatic stress disorder)      Family History  Problem Relation Age of Onset   Hypertension Mother    Hypertension Father    Stroke Father      Current Outpatient Medications:    benzonatate (TESSALON) 100 MG capsule, Take by mouth., Disp: , Rfl:    buPROPion (WELLBUTRIN XL) 150 MG 24 hr tablet, Take by mouth., Disp: , Rfl:    fluticasone (FLONASE) 50 MCG/ACT nasal spray, Place 2 sprays into both nostrils 2 (two) times daily as needed for allergies or rhinitis., Disp: 9.9 g, Rfl: 2   guaiFENesin (MUCINEX) 600 MG 12 hr tablet, Take 1 tablet (600 mg total) by mouth 2 (two) times daily., Disp: 60 tablet, Rfl: 2   hydrochlorothiazide (HYDRODIURIL) 25 MG tablet, Take by mouth., Disp: , Rfl:    hydrOXYzine (ATARAX) 25  MG tablet, 25 mg., Disp: , Rfl:    loratadine (CLARITIN) 10 MG tablet, Take by mouth., Disp: , Rfl:    tamsulosin (FLOMAX) 0.4 MG CAPS capsule, Take 1 capsule (0.4 mg total) by mouth daily., Disp: 90 capsule, Rfl: 2   tiZANidine (ZANAFLEX) 4 MG tablet, Take 1 tablet (4 mg total) by mouth daily. (Patient not taking: Reported on 10/04/2022), Disp: 30 tablet, Rfl: 1   No Known Allergies   Review of Systems  Constitutional: Negative.  Negative for appetite change, chills, fatigue and fever.  HENT:  Positive for congestion and postnasal drip. Negative for ear pain, sinus pressure and sinus pain.   Respiratory: Negative.    Cardiovascular: Negative.   Gastrointestinal:  Positive for blood in stool. Negative for abdominal pain, constipation, diarrhea, rectal pain and vomiting.  Skin: Negative.   Allergic/Immunologic: Negative.   Neurological: Negative.   Hematological: Negative.      Today's Vitals   03/01/23 1422  BP: 126/80  Pulse: 94  Temp: 98.7 F (37.1 C)  SpO2: 98%  Weight: 246 lb 3.2 oz (111.7 kg)  Height: 5\' 9"  (1.753 m)   Body mass index is 36.36 kg/m.  Wt Readings from Last 3 Encounters:  03/01/23 246 lb 3.2 oz (111.7 kg)  02/12/23 248 lb (112.5 kg)  10/04/22 252 lb (114.3 kg)  Objective:  Physical Exam Exam conducted with a chaperone present Randa Lynn).  HENT:     Right Ear: Tympanic membrane normal.     Left Ear: Tympanic membrane normal.     Mouth/Throat:     Tonsils: No tonsillar exudate or tonsillar abscesses.  Abdominal:     General: Bowel sounds are normal.  Genitourinary:    Rectum: Normal. Guaiac result positive. No tenderness or external hemorrhoid.  Neurological:     Mental Status: He is alert.         Assessment And Plan:  Blood in stool -     Ambulatory referral to Gastroenterology -     POC Hemoccult Bld/Stl (1-Cd Office Dx)  Encounter for screening fecal occult blood testing  Post-nasal drainage -     guaiFENesin ER; Take 1  tablet (600 mg total) by mouth 2 (two) times daily.  Dispense: 60 tablet; Refill: 2  Nasal congestion -     Fluticasone Propionate; Place 2 sprays into both nostrils 2 (two) times daily as needed for allergies or rhinitis.  Dispense: 9.9 g; Refill: 2  Class 2 severe obesity due to excess calories with serious comorbidity and body mass index (BMI) of 36.0 to 36.9 in adult [facility])  He is encouraged to strive for BMI less than 30 to decrease cardiac risk. Advised to aim for at least 150 minutes of exercise per week.    Return if symptoms worsen or fail to improve, for keep Appt.  Patient was given opportunity to ask questions. Patient verbalized understanding of the plan and was able to repeat key elements of the plan. All questions were answered to their satisfaction.  Ellender Hose, NP  I, Ellender Hose, NP, have reviewed all documentation for this visit. The documentation on 03/03/23 for the exam, diagnosis, procedures, and orders are all accurate and complete.    IF YOU HAVE BEEN REFERRED TO A SPECIALIST, IT MAY TAKE 1-2 WEEKS TO SCHEDULE/PROCESS THE REFERRAL. IF YOU HAVE NOT HEARD FROM US/SPECIALIST IN TWO WEEKS, PLEASE GIVE [place] A CALL AT [phone].   THE PATIENT IS ENCOURAGED TO PRACTICE SOCIAL DISTANCING DUE TO THE COVID-19 PANDEMIC.

## 2023-03-05 ENCOUNTER — Encounter: Payer: Self-pay | Admitting: Family Medicine

## 2023-03-05 DIAGNOSIS — Z1211 Encounter for screening for malignant neoplasm of colon: Secondary | ICD-10-CM | POA: Insufficient documentation

## 2023-03-05 DIAGNOSIS — R0982 Postnasal drip: Secondary | ICD-10-CM | POA: Insufficient documentation

## 2023-03-05 DIAGNOSIS — R0981 Nasal congestion: Secondary | ICD-10-CM | POA: Insufficient documentation

## 2023-03-05 DIAGNOSIS — K921 Melena: Secondary | ICD-10-CM | POA: Insufficient documentation

## 2023-03-05 HISTORY — DX: Encounter for screening for malignant neoplasm of colon: Z12.11

## 2023-03-08 ENCOUNTER — Encounter: Payer: Self-pay | Admitting: Internal Medicine

## 2023-03-16 DIAGNOSIS — R350 Frequency of micturition: Secondary | ICD-10-CM | POA: Diagnosis not present

## 2023-03-16 DIAGNOSIS — R3912 Poor urinary stream: Secondary | ICD-10-CM | POA: Diagnosis not present

## 2023-06-04 LAB — HM COLONOSCOPY

## 2023-08-27 ENCOUNTER — Ambulatory Visit (INDEPENDENT_AMBULATORY_CARE_PROVIDER_SITE_OTHER): Payer: Medicare Other | Admitting: Internal Medicine

## 2023-08-27 ENCOUNTER — Encounter: Payer: Self-pay | Admitting: Internal Medicine

## 2023-08-27 VITALS — BP 122/84 | HR 98 | Temp 98.8°F | Ht 69.0 in | Wt 260.6 lb

## 2023-08-27 DIAGNOSIS — I129 Hypertensive chronic kidney disease with stage 1 through stage 4 chronic kidney disease, or unspecified chronic kidney disease: Secondary | ICD-10-CM

## 2023-08-27 DIAGNOSIS — R7309 Other abnormal glucose: Secondary | ICD-10-CM

## 2023-08-27 DIAGNOSIS — R251 Tremor, unspecified: Secondary | ICD-10-CM | POA: Diagnosis not present

## 2023-08-27 DIAGNOSIS — N182 Chronic kidney disease, stage 2 (mild): Secondary | ICD-10-CM

## 2023-08-27 DIAGNOSIS — R221 Localized swelling, mass and lump, neck: Secondary | ICD-10-CM

## 2023-08-27 DIAGNOSIS — Z6838 Body mass index (BMI) 38.0-38.9, adult: Secondary | ICD-10-CM | POA: Diagnosis not present

## 2023-08-27 DIAGNOSIS — R351 Nocturia: Secondary | ICD-10-CM | POA: Diagnosis not present

## 2023-08-27 DIAGNOSIS — Z0000 Encounter for general adult medical examination without abnormal findings: Secondary | ICD-10-CM

## 2023-08-27 DIAGNOSIS — E66812 Obesity, class 2: Secondary | ICD-10-CM

## 2023-08-27 LAB — POCT URINALYSIS DIPSTICK
Bilirubin, UA: NEGATIVE
Glucose, UA: NEGATIVE
Ketones, UA: NEGATIVE
Nitrite, UA: NEGATIVE
Protein, UA: POSITIVE — AB
Spec Grav, UA: 1.025 (ref 1.010–1.025)
Urobilinogen, UA: 0.2 U/dL
pH, UA: 5.5 (ref 5.0–8.0)

## 2023-08-27 NOTE — Assessment & Plan Note (Signed)
 He is encouraged to initially strive for BMI less than 30 to decrease cardiac risk. He is advised to exercise no less than 150 minutes per week.

## 2023-08-27 NOTE — Assessment & Plan Note (Signed)
 Chronic, he is encouraged to keep BP well controlled and to stay well hydrated to decrease risk of CKD progression.

## 2023-08-27 NOTE — Assessment & Plan Note (Signed)
 Previous labs reviewed, his A1c has been elevated in the past. I will check an A1c today. Reminded to avoid refined sugars including sugary drinks/foods and processed meats including bacon, sausages and deli meats.

## 2023-08-27 NOTE — Assessment & Plan Note (Addendum)
 He had normal TSH in September 2024. He should decrease his caffeine intake. He is followed by Neurology at the [facility].  No need for intervention on my part.

## 2023-08-27 NOTE — Assessment & Plan Note (Signed)
 A full exam was performed.  DRE deferred, he has prostate exams performed at the [place].  He is also followed by Urology.  He is advised to get 30-45 minutes of regular exercise, no less than four to five days per week. Both weight-bearing and aerobic exercises are recommended.  He is advised to follow a healthy diet with at least six fruits/veggies per day, decrease intake of red meat and other saturated fats and to increase fish intake to twice weekly.  Meats/fish should not be fried -- baked, boiled or broiled is preferable. It is also important to cut back on your sugar intake.  Be sure to read labels - try to avoid anything with added sugar, high fructose corn syrup or other sweeteners.  If you must use a sweetener, you can try stevia or monkfruit.  It is also important to avoid artificially sweetened foods/beverages and diet drinks. Lastly, wear SPF 50 sunscreen on exposed skin and when in direct sunlight for an extended period of time.  Be sure to avoid fast food restaurants and aim for at least 60 ounces of water daily.

## 2023-08-27 NOTE — Patient Instructions (Signed)
 Health Maintenance, Male  Adopting a healthy lifestyle and getting preventive care are important in promoting health and wellness. Ask your health care provider about:  The right schedule for you to have regular tests and exams.  Things you can do on your own to prevent diseases and keep yourself healthy.  What should I know about diet, weight, and exercise?  Eat a healthy diet    Eat a diet that includes plenty of vegetables, fruits, low-fat dairy products, and lean protein.  Do not eat a lot of foods that are high in solid fats, added sugars, or sodium.  Maintain a healthy weight  Body mass index (BMI) is a measurement that can be used to identify possible weight problems. It estimates body fat based on height and weight. Your health care provider can help determine your BMI and help you achieve or maintain a healthy weight.  Get regular exercise  Get regular exercise. This is one of the most important things you can do for your health. Most adults should:  Exercise for at least 150 minutes each week. The exercise should increase your heart rate and make you sweat (moderate-intensity exercise).  Do strengthening exercises at least twice a week. This is in addition to the moderate-intensity exercise.  Spend less time sitting. Even light physical activity can be beneficial.  Watch cholesterol and blood lipids  Have your blood tested for lipids and cholesterol at 77 years of age, then have this test every 5 years.  You may need to have your cholesterol levels checked more often if:  Your lipid or cholesterol levels are high.  You are older than 77 years of age.  You are at high risk for heart disease.  What should I know about cancer screening?  Many types of cancers can be detected early and may often be prevented. Depending on your health history and family history, you may need to have cancer screening at various ages. This may include screening for:  Colorectal cancer.  Prostate cancer.  Skin cancer.  Lung  cancer.  What should I know about heart disease, diabetes, and high blood pressure?  Blood pressure and heart disease  High blood pressure causes heart disease and increases the risk of stroke. This is more likely to develop in people who have high blood pressure readings or are overweight.  Talk with your health care provider about your target blood pressure readings.  Have your blood pressure checked:  Every 3-5 years if you are 77-95 years of age.  Every year if you are 77 years old or older.  If you are between the ages of 29 and 29 and are a current or former smoker, ask your health care provider if you should have a one-time screening for abdominal aortic aneurysm (AAA).  Diabetes  Have regular diabetes screenings. This checks your fasting blood sugar level. Have the screening done:  Once every three years after age 77 if you are at a normal weight and have a low risk for diabetes.  More often and at a younger age if you are overweight or have a high risk for diabetes.  What should I know about preventing infection?  Hepatitis B  If you have a higher risk for hepatitis B, you should be screened for this virus. Talk with your health care provider to find out if you are at risk for hepatitis B infection.  Hepatitis C  Blood testing is recommended for:  Everyone born from [birth year] through 1964.  Anyone  with known risk factors for hepatitis C.  Sexually transmitted infections (STIs)  You should be screened each year for STIs, including gonorrhea and chlamydia, if:  You are sexually active and are younger than 77 years of age.  You are older than 77 years of age and your health care provider tells you that you are at risk for this type of infection.  Your sexual activity has changed since you were last screened, and you are at increased risk for chlamydia or gonorrhea. Ask your health care provider if you are at risk.  Ask your health care provider about whether you are at high risk for HIV. Your health care provider  may recommend a prescription medicine to help prevent HIV infection. If you choose to take medicine to prevent HIV, you should first get tested for HIV. You should then be tested every 3 months for as long as you are taking the medicine.  Follow these instructions at home:  Alcohol use  Do not drink alcohol if your health care provider tells you not to drink.  If you drink alcohol:  Limit how much you have to 0-2 drinks a day.  Know how much alcohol is in your drink. In the [place] one drink equals one 12 oz bottle of beer (355 mL), one 5 oz glass of wine (148 mL), or one 1 oz glass of hard liquor (44 mL).  Lifestyle  Do not use any products that contain nicotine or tobacco. These products include cigarettes, chewing tobacco, and vaping devices, such as e-cigarettes. If you need help quitting, ask your health care provider.  Do not use street drugs.  Do not share needles.  Ask your health care provider for help if you need support or information about quitting drugs.  General instructions  Schedule regular health, dental, and eye exams.  Stay current with your vaccines.  Tell your health care provider if:  You often feel depressed.  You have ever been abused or do not feel safe at home.  Summary  Adopting a healthy lifestyle and getting preventive care are important in promoting health and wellness.  Follow your health care provider's instructions about healthy diet, exercising, and getting tested or screened for diseases.  Follow your health care provider's instructions on monitoring your cholesterol and blood pressure.  This information is not intended to replace advice given to you by your health care provider. Make sure you discuss any questions you have with your health care provider.  Document Revised: 10/11/2020 Document Reviewed: 10/11/2020  Elsevier Patient Education  2024 [employer].

## 2023-08-27 NOTE — Progress Notes (Signed)
 I,Victoria T Deloria Lair, CMA,acting as a Neurosurgeon for Gwynneth Aliment, MD.,have documented all relevant documentation on the behalf of Gwynneth Aliment, MD,as directed by  Gwynneth Aliment, MD while in the presence of Gwynneth Aliment, MD.  Subjective:   Patient ID: Christopher Harvey , male    DOB: Mar 13, 1946 , 78 y.o.   MRN: [id number]  Chief Complaint  Patient presents with   Annual Exam   Hypertension    HPI  Patient presents today for annual exam. patient reports compliance with medications. He admits he did not take BP meds today.  He denies having any headaches, chest pain and shortness of breath.   [facility]   He reports having a flexible sigmoidoscopy in December 2024, pos for one polyp. He is not sure of pathology.     He would like to discuss tremors, sx started about 3 months ago.  It happens in both hands, but more noticeable in the left hand.   He is right-handed. He is now followed by Neurology at the [facility]. He is not on any treatment at this time.    Hypertension This is a chronic problem. The current episode started more than 1 year ago. The problem has been gradually improving since onset. The problem is controlled. Pertinent negatives include no blurred vision, chest pain, palpitations or shortness of breath. Risk factors for coronary artery disease include obesity and male gender. The current treatment provides moderate improvement. Compliance problems include exercise.      Past Medical History:  Diagnosis Date   Cataract    Removed by [facility] [place] [facility]   Encounter for screening fecal occult blood testing 03/05/2023   Hypertension    PTSD (post-traumatic stress disorder)      Family History  Problem Relation Age of Onset   Hypertension Mother    Hypertension Father    Stroke Father      Current Outpatient Medications:    benzonatate (TESSALON) 100 MG capsule, Take by mouth., Disp: , Rfl:    buPROPion (WELLBUTRIN XL) 150 MG 24 hr  tablet, Take by mouth., Disp: , Rfl:    finasteride (PROSCAR) 5 MG tablet, Take by mouth., Disp: , Rfl:    fluticasone (FLONASE) 50 MCG/ACT nasal spray, Place 2 sprays into both nostrils 2 (two) times daily as needed for allergies or rhinitis., Disp: 9.9 g, Rfl: 2   guaiFENesin (MUCINEX) 600 MG 12 hr tablet, Take 1 tablet (600 mg total) by mouth 2 (two) times daily., Disp: 60 tablet, Rfl: 2   hydrochlorothiazide (HYDRODIURIL) 25 MG tablet, Take by mouth., Disp: , Rfl:    hydrOXYzine (ATARAX) 25 MG tablet, 25 mg., Disp: , Rfl:    loratadine (CLARITIN) 10 MG tablet, Take by mouth., Disp: , Rfl:    tamsulosin (FLOMAX) 0.4 MG CAPS capsule, Take 1 capsule (0.4 mg total) by mouth daily., Disp: 90 capsule, Rfl: 2   No Known Allergies   Men's preventive visit. Patient Health Questionnaire (PHQ-2) is  Flowsheet Row Office Visit from 02/12/2023 in [facility]  PHQ-2 Total Score 0     . Patient is on a low salt diet. Marital status: Married. Relevant history for alcohol use is:  Social History   Substance and Sexual Activity  Alcohol Use Yes   Alcohol/week: 1.0 - 4.0 standard drink of alcohol   Types: 1 - 2 Glasses of wine per week   Comment: occasionally   . Relevant history  for tobacco use is:  Social History   Tobacco Use  Smoking Status Never  Smokeless Tobacco Never  .   Review of Systems  Constitutional: Negative.   HENT: Negative.    Eyes:  Negative for blurred vision.  Respiratory: Negative.  Negative for shortness of breath.   Cardiovascular: Negative.  Negative for chest pain and palpitations.  Genitourinary:        POS nocturia  Skin: Negative.   Allergic/Immunologic: Negative.   Neurological:  Positive for tremors.  Hematological: Negative.      Today's Vitals   08/27/23 1407  BP: 122/84  Pulse: 98  Temp: 98.8 F (37.1 C)  SpO2: 98%  Weight: 260 lb 9.6 oz (118.2 kg)  Height: 5\' 9"  (1.753 m)   Body mass index is 38.48 kg/m.   Wt Readings from Last 3 Encounters:  08/27/23 260 lb 9.6 oz (118.2 kg)  03/01/23 246 lb 3.2 oz (111.7 kg)  02/12/23 248 lb (112.5 kg)    Objective:  Physical Exam Vitals and nursing note reviewed.  Constitutional:      Appearance: Normal appearance. He is obese.  HENT:     Head: Normocephalic and atraumatic.     Right Ear: Tympanic membrane, ear canal and external ear normal.     Left Ear: Tympanic membrane, ear canal and external ear normal.     Mouth/Throat:     Pharynx: No oropharyngeal exudate or posterior oropharyngeal erythema.  Eyes:     Extraocular Movements: Extraocular movements intact.     Conjunctiva/sclera: Conjunctivae normal.     Pupils: Pupils are equal, round, and reactive to light.  Neck:     Comments: Left sided soft tissue swelling, lateral to thyroid Cardiovascular:     Rate and Rhythm: Normal rate and regular rhythm.     Pulses: Normal pulses.     Heart sounds: Normal heart sounds.  Pulmonary:     Effort: Pulmonary effort is normal.     Breath sounds: Normal breath sounds.  Chest:  Breasts:    Right: Normal. No swelling, bleeding, inverted nipple, mass or nipple discharge.     Left: Normal. No swelling, bleeding, inverted nipple, mass or nipple discharge.  Abdominal:     General: Bowel sounds are normal.     Palpations: Abdomen is soft.     Comments: Rounded. Soft, obese  Genitourinary:    Comments: Deferred  Musculoskeletal:        General: Normal range of motion.     Cervical back: Normal range of motion and neck supple.     Comments: Pos straight leg test on the left  Skin:    General: Skin is warm.  Neurological:     General: No focal deficit present.     Mental Status: He is alert and oriented to person, place, and time.  Psychiatric:        Mood and Affect: Mood normal.        Behavior: Behavior normal.         Assessment And Plan:    Annual physical exam Assessment & Plan: A full exam was performed.  DRE deferred, he has  prostate exams performed at the [place].  He is also followed by Urology.  He is advised to get 30-45 minutes of regular exercise, no less than four to five days per week. Both weight-bearing and aerobic exercises are recommended.  He is advised to follow a healthy diet with at least six fruits/veggies per day, decrease intake of red  meat and other saturated fats and to increase fish intake to twice weekly.  Meats/fish should not be fried -- baked, boiled or broiled is preferable. It is also important to cut back on your sugar intake.  Be sure to read labels - try to avoid anything with added sugar, high fructose corn syrup or other sweeteners.  If you must use a sweetener, you can try stevia or monkfruit.  It is also important to avoid artificially sweetened foods/beverages and diet drinks. Lastly, wear SPF 50 sunscreen on exposed skin and when in direct sunlight for an extended period of time.  Be sure to avoid fast food restaurants and aim for at least 60 ounces of water daily.       Parenchymal renal hypertension, stage 1 through stage 4 or unspecified chronic kidney disease Assessment & Plan: Chronic, fair control. Goal BP<120/80.  EKG performed, NSR w/ nonspecific T-abnormality. He is also managed at the [place]. He will continue with hydrochlorothiazide 25mg  daily. He is encouraged to follow a low sodium diet.   Orders: -     POCT urinalysis dipstick -     Microalbumin / creatinine urine ratio -     EKG 12-Lead -     CBC -     CMP14+EGFR -     Lipid panel  Chronic renal disease, stage II Assessment & Plan: Chronic, he is encouraged to keep BP well controlled and to stay well hydrated to decrease risk of CKD progression.     Localized swelling, mass or lump of neck Assessment & Plan: There is swelling lateral to his thyroid, located on right side of neck. He declines radiographic studies, [facility]    Tremor Assessment & Plan: He had normal TSH in September  2024. He should decrease his caffeine intake. He is followed by Neurology at the [facility].  No need for intervention on my part.    Other abnormal glucose Assessment & Plan: Previous labs reviewed, his A1c has been elevated in the past. I will check an A1c today. Reminded to avoid refined sugars including sugary drinks/foods and processed meats including bacon, sausages and deli meats.    Orders: -     Hemoglobin A1c  Nocturia -     PSA  Class 2 severe obesity due to excess calories with serious comorbidity and body mass index (BMI) of 38.0 to 38.9 in adult [facility]) Assessment & Plan: He is encouraged to initially strive for BMI less than 30 to decrease cardiac risk. He is advised to exercise no less than 150 minutes per week.     he is encouraged to strive for BMI less than 30 to decrease cardiac risk. Advised to aim for at least 150 minutes of exercise per week.    Return for 1 year HM, 4 MONTH BPC. Patient was given opportunity to ask questions. Patient verbalized understanding of the plan and was able to repeat key elements of the plan. All questions were answered to their satisfaction.    I, Gwynneth Aliment, MD, have reviewed all documentation for this visit. The documentation on 08/27/23 for the exam, diagnosis, procedures, and orders are all accurate and complete.

## 2023-08-28 LAB — LIPID PANEL
Chol/HDL Ratio: 3.9 ratio (ref 0.0–5.0)
Cholesterol, Total: 160 mg/dL (ref 100–199)
HDL: 41 mg/dL (ref >39)
LDL Chol Calc (NIH): 100 mg/dL — ABNORMAL HIGH (ref 0–99)
Triglycerides: 106 mg/dL (ref 0–149)
VLDL Cholesterol Cal: 19 mg/dL (ref 5–40)

## 2023-08-28 LAB — CMP14+EGFR
ALT: 31 IU/L (ref 0–44)
AST: 21 IU/L (ref 0–40)
Albumin: 4.2 g/dL (ref 3.8–4.8)
Alkaline Phosphatase: 80 IU/L (ref 44–121)
BUN/Creatinine Ratio: 17 (ref 10–24)
BUN: 18 mg/dL (ref 8–27)
Bilirubin Total: 0.3 mg/dL (ref 0.0–1.2)
CO2: 24 mmol/L (ref 20–29)
Calcium: 9.7 mg/dL (ref 8.6–10.2)
Chloride: 104 mmol/L (ref 96–106)
Creatinine, Ser: 1.03 mg/dL (ref 0.76–1.27)
Globulin, Total: 2.9 g/dL (ref 1.5–4.5)
Glucose: 121 mg/dL — ABNORMAL HIGH (ref 70–99)
Potassium: 4.3 mmol/L (ref 3.5–5.2)
Sodium: 141 mmol/L (ref 134–144)
Total Protein: 7.1 g/dL (ref 6.0–8.5)
eGFR: 75 mL/min/{1.73_m2} (ref >59)

## 2023-08-28 LAB — CBC
Hematocrit: 42.1 % (ref 37.5–51.0)
Hemoglobin: 14.2 g/dL (ref 13.0–17.7)
MCH: 31.3 pg (ref 26.6–33.0)
MCHC: 33.7 g/dL (ref 31.5–35.7)
MCV: 93 fL (ref 79–97)
Platelets: 207 10*3/uL (ref 150–450)
RBC: 4.53 x10E6/uL (ref 4.14–5.80)
RDW: 11.8 % (ref 11.6–15.4)
WBC: 7.7 10*3/uL (ref 3.4–10.8)

## 2023-08-28 LAB — HEMOGLOBIN A1C
Est. average glucose Bld gHb Est-mCnc: 123 mg/dL
Hgb A1c MFr Bld: 5.9 % — ABNORMAL HIGH (ref 4.8–5.6)

## 2023-08-29 LAB — MICROALBUMIN / CREATININE URINE RATIO
Creatinine, Urine: 167.9 mg/dL
Microalb/Creat Ratio: 18 mg/g{creat} (ref 0–29)
Microalbumin, Urine: 29.8 ug/mL

## 2023-09-02 DIAGNOSIS — R221 Localized swelling, mass and lump, neck: Secondary | ICD-10-CM | POA: Insufficient documentation

## 2023-09-02 NOTE — Assessment & Plan Note (Signed)
 There is swelling lateral to his thyroid, located on right side of neck. He declines radiographic studies, [facility].

## 2023-09-02 NOTE — Assessment & Plan Note (Signed)
 Chronic, fair control. Goal BP<120/80.  EKG performed, NSR w/ nonspecific T-abnormality. He is also managed at the [place]. He will continue with hydrochlorothiazide 25mg  daily. He is encouraged to follow a low sodium diet.

## 2023-09-19 ENCOUNTER — Encounter: Payer: Self-pay | Admitting: Internal Medicine

## 2023-09-19 LAB — PSA: Prostate Specific Ag, Serum: 5.5 ng/mL — ABNORMAL HIGH (ref 0.0–4.0)

## 2023-09-19 LAB — SPECIMEN STATUS REPORT

## 2023-10-10 ENCOUNTER — Ambulatory Visit: Payer: Medicare Other

## 2023-10-10 VITALS — BP 126/62 | HR 96 | Temp 98.2°F | Ht 69.0 in | Wt 263.0 lb

## 2023-10-10 DIAGNOSIS — Z0000 Encounter for general adult medical examination without abnormal findings: Secondary | ICD-10-CM

## 2023-10-10 NOTE — Patient Instructions (Signed)
 Christopher Harvey , Thank you for taking time to come for your Medicare Wellness Visit. I appreciate your ongoing commitment to your health goals. Please review the following plan we discussed and let me know if I can assist you in the future.   Referrals/Orders/Follow-Ups/Clinician Recommendations: none  This is a list of the screening recommended for you and due dates:  Health Maintenance  Topic Date Due   COVID-19 Vaccine (6 - 2024-25 season) 02/04/2023   Flu Shot  01/04/2024   Medicare Annual Wellness Visit  10/09/2024   Colon Cancer Screening  06/03/2026   DTaP/Tdap/Td vaccine (5 - Td or Tdap) 08/18/2030   Pneumonia Vaccine  Completed   Hepatitis C Screening  Completed   Zoster (Shingles) Vaccine  Completed   HPV Vaccine  Aged Out   Meningitis B Vaccine  Aged Out    Advanced directives: (Declined) Advance directive discussed with you today. Even though you declined this today, please call our office should you change your mind, and we can give you the proper paperwork for you to fill out.  Next Medicare Annual Wellness Visit scheduled for next year: No, office will schedule  Have you seen your provider in the last 6 months (3 months if uncontrolled diabetes)? Yes, appointment 12/27/2023  insert Preventive Care attachment Insert FALL PREVENTION attachment if needed

## 2023-10-10 NOTE — Progress Notes (Signed)
 Subjective:   Gonzales Maslowski is a 78 y.o. who presents for a Medicare Wellness preventive visit.  Visit Complete: In person    Persons Participating in Visit: Patient.  AWV Questionnaire: No: Patient Medicare AWV questionnaire was not completed prior to this visit.  Cardiac Risk Factors include: advanced age (>56men, >33 women);hypertension;male gender     Objective:    Today's Vitals   10/10/23 1134  BP: 126/62  Pulse: 96  Temp: 98.2 F (36.8 C)  TempSrc: Oral  SpO2: 94%  Weight: 263 lb (119.3 kg)  Height: 5\' 9"  (1.753 m)   Body mass index is 38.84 kg/m.     10/10/2023   11:44 AM 10/04/2022   11:01 AM 09/21/2021    4:06 PM 09/08/2020    9:08 AM 03/29/2020    7:14 PM 08/27/2019    2:27 PM 08/15/2018    2:38 PM  Advanced Directives  Does Patient Have a Medical Advance Directive? No No No Yes No Yes No  Type of Ambulance person of [place];Living will   Copy of Healthcare Power of Attorney in Chart?    No - copy requested  No - copy requested   Would patient like information on creating a medical advance directive? No - Patient declined No - Patient declined     No - Patient declined    Current Medications (verified) Outpatient Encounter Medications as of 10/10/2023  Medication Sig   buPROPion (WELLBUTRIN XL) 150 MG 24 hr tablet Take by mouth.   hydrochlorothiazide (HYDRODIURIL) 25 MG tablet Take by mouth.   hydrOXYzine (ATARAX) 25 MG tablet 25 mg.   loratadine (CLARITIN) 10 MG tablet Take by mouth.   tamsulosin  (FLOMAX ) 0.4 MG CAPS capsule Take 1 capsule (0.4 mg total) by mouth daily.   benzonatate (TESSALON) 100 MG capsule Take by mouth. (Patient not taking: Reported on 10/10/2023)   finasteride (PROSCAR) 5 MG tablet Take by mouth. (Patient not taking: Reported on 10/10/2023)   fluticasone  (FLONASE ) 50 MCG/ACT nasal spray Place 2 sprays into both nostrils 2 (two) times daily as needed for allergies or rhinitis. (Patient  not taking: Reported on 10/10/2023)   guaiFENesin  (MUCINEX ) 600 MG 12 hr tablet Take 1 tablet (600 mg total) by mouth 2 (two) times daily. (Patient not taking: Reported on 10/10/2023)   No facility-administered encounter medications on file as of 10/10/2023.    Allergies (verified) Patient has no known allergies.   History: Past Medical History:  Diagnosis Date   Cataract    [facility]   Encounter for screening fecal occult blood testing 03/05/2023   Hypertension    PTSD (post-traumatic stress disorder)    Past Surgical History:  Procedure Laterality Date   CATARACT EXTRACTION, BILATERAL Bilateral 2019   at [facility]   oculoplasty Bilateral 09/16/2022   Performed at [facility] for ptosis   Family History  Problem Relation Age of Onset   Hypertension Mother    Hypertension Father    Stroke Father    Social History   Socioeconomic History   Marital status: Married    Spouse name: Not on file   Number of children: Not on file   Years of education: Not on file   Highest education level: Not on file  Occupational History   Occupation: retired  Tobacco Use   Smoking status: Never   Smokeless tobacco: Never  Vaping Use   Vaping status: Never Used  Substance and Sexual Activity   Alcohol use: Yes    Alcohol/week: 1.0 - 4.0 standard drink of alcohol    Types: 1 - 2 Glasses of wine per week    Comment: occasionally    Drug use: Not Currently   Sexual activity: Not Currently  Other Topics Concern   Not on file  Social History Narrative   Not on file   Social Drivers of Health   Financial Resource Strain: Low Risk  (10/10/2023)   Overall Financial Resource Strain (CARDIA)    Difficulty of Paying Living Expenses: Not hard at all  Food Insecurity: No Food Insecurity (10/10/2023)   Hunger Vital Sign    Worried About Running Out of Food in the Last Year: Never true    Ran Out of Food in the Last Year: Never true  Transportation Needs: No Transportation  Needs (10/10/2023)   PRAPARE - Administrator, Civil Service (Medical): No    Lack of Transportation (Non-Medical): No  Physical Activity: Insufficiently Active (10/10/2023)   Exercise Vital Sign    Days of Exercise per Week: 2 days    Minutes of Exercise per Session: 20 min  Stress: No Stress Concern Present (10/10/2023)   [employer] of Occupational Health - Occupational Stress Questionnaire    Feeling of Stress : Only a little  Social Connections: Unknown (10/10/2023)   Social Connection and Isolation Panel [NHANES]    Frequency of Communication with Friends and Family: Once a week    Frequency of Social Gatherings with Friends and Family: Not on file    Attends Religious Services: More than 4 times per year    Active Member of [employer] or Organizations: No    Attends Banker Meetings: Never    Marital Status: Married    Tobacco Counseling Counseling given: Not Answered    Clinical Intake:  Pre-visit preparation completed: Yes  Pain : No/denies pain     Nutritional Status: BMI > 30  Obese Nutritional Risks: None Diabetes: No  Lab Results  Component Value Date   HGBA1C 5.9 (H) 08/27/2023   HGBA1C 5.5 02/12/2023   HGBA1C 5.7 (H) 08/11/2022     How often do you need to have someone help you when you read instructions, pamphlets, or other written materials from your doctor or pharmacy?: 1 - Never  Interpreter Needed?: No  Information entered by :: NAllen LPN   Activities of Daily Living     10/10/2023   11:36 AM  In your present state of health, do you have any difficulty performing the following activities:  Hearing? 0  Vision? 0  Difficulty concentrating or making decisions? 0  Walking or climbing stairs? 1  Comment sob sometimes  Dressing or bathing? 0  Doing errands, shopping? 0  Preparing Food and eating ? N  Using the Toilet? N  In the past six months, have you accidently leaked urine? N  Comment has enlarged prostate  Do you  have problems with loss of bowel control? N  Managing your Medications? N  Managing your Finances? N  Housekeeping or managing your Housekeeping? N    Patient Care Team: Cleave Curling, MD as PCP - General (Internal Medicine)  Indicate any recent Medical Services you may have received from other than [facility] providers in the past year (date may be approximate).     Assessment:   This is a routine wellness examination for Florindo.  Hearing/Vision screen Hearing Screening - Comments:: Denies hearing issues Vision Screening -  Comments:: [facility]   Goals Addressed             This Visit's Progress    Patient Stated       10/10/2023, live and be as healthy as possible       Depression Screen     10/10/2023   11:45 AM 02/12/2023    2:50 PM 10/04/2022   11:01 AM 08/11/2022    2:25 PM 09/21/2021    4:07 PM 09/14/2021   11:46 AM 09/08/2020    9:09 AM  PHQ 2/9 Scores  PHQ - 2 Score 1 0 0 0 0 0 0  PHQ- 9 Score 1 0         Fall Risk     10/10/2023   11:45 AM 08/27/2023    2:15 PM 02/12/2023    2:49 PM 09/30/2022    5:58 PM 08/11/2022    2:25 PM  Fall Risk   Falls in the past year? 0 0 0 0 0  Number falls in past yr: 0 0 0 0 0  Injury with Fall? 0 0  0 0  Risk for fall due to : Medication side effect No Fall Risks No Fall Risks Medication side effect No Fall Risks  Follow up Falls prevention discussed;Falls evaluation completed Falls evaluation completed Falls evaluation completed Falls prevention discussed;Education provided;Falls evaluation completed Falls evaluation completed    MEDICARE RISK AT HOME:  Medicare Risk at Home Any stairs in or around the home?: Yes If so, are there any without handrails?: No Home free of loose throw rugs in walkways, pet beds, electrical cords, etc?: Yes Adequate lighting in your home to reduce risk of falls?: Yes Use of a cane, walker or w/c?: No Grab bars in the bathroom?: Yes Shower chair or bench in shower?: No Elevated toilet seat  or a handicapped toilet?: No  TIMED UP AND GO:  Was the test performed?  Yes  Length of time to ambulate 10 feet: 5 sec Gait steady and fast without use of assistive device  Cognitive Function: 6CIT completed        10/10/2023   11:46 AM 10/04/2022   11:02 AM 09/21/2021    4:09 PM 09/08/2020    9:10 AM 08/27/2019    2:30 PM  6CIT Screen  What Year? 0 points 0 points 0 points 0 points 0 points  What month? 0 points 0 points 0 points 0 points 0 points  What time? 0 points 0 points 0 points 0 points 0 points  Count back from 20 0 points 0 points 0 points 0 points 0 points  Months in reverse 0 points 0 points 0 points 0 points 0 points  Repeat phrase 2 points 2 points 0 points 0 points 6 points  Total Score 2 points 2 points 0 points 0 points 6 points    Immunizations Immunization History  Administered Date(s) Administered   Fluad Quad(high Dose 65+) 03/03/2020, 03/14/2021   Influenza,inj,Quad PF,6+ Mos 02/21/2022   Influenza-Unspecified 02/19/2019, 03/05/2021, 02/15/2023   Moderna Covid-19 Fall Seasonal Vaccine 51yrs & older 08/23/2022   PFIZER(Purple Top)SARS-COV-2 Vaccination 07/22/2019, 08/12/2019, 03/16/2020   Pfizer Covid-19 Vaccine Bivalent Booster 60yrs & up 05/20/2021   Pneumococcal Conjugate-13 03/10/2015   Pneumococcal Polysaccharide-23 04/19/2012, 10/04/2017   Pneumococcal-Unspecified 09/14/2011   Tdap 06/30/2009, 04/19/2012, 03/29/2020, 08/17/2020   Zoster Recombinant(Shingrix) 08/08/2018, 08/19/2018, 02/27/2019   Zoster, Live 09/08/2015    Screening Tests Health Maintenance  Topic Date Due   COVID-19 Vaccine (6 -  2024-25 season) 02/04/2023   INFLUENZA VACCINE  01/04/2024   Medicare Annual Wellness (AWV)  10/09/2024   Colonoscopy  06/03/2026   DTaP/Tdap/Td (5 - Td or Tdap) 08/18/2030   Pneumonia Vaccine 84+ Years old  Completed   Hepatitis C Screening  Completed   Zoster Vaccines- Shingrix  Completed   HPV VACCINES  Aged Out   Meningococcal B Vaccine  Aged  Out    Health Maintenance  Health Maintenance Due  Topic Date Due   COVID-19 Vaccine (6 - 2024-25 season) 02/04/2023   Health Maintenance Items Addressed: Gets vaccines at the [place].  Additional Screening:  Vision Screening: Recommended annual ophthalmology exams for early detection of glaucoma and other disorders of the eye.  Dental Screening: Recommended annual dental exams for proper oral hygiene  Community Resource Referral / Chronic Care Management: CRR required this visit?  No   CCM required this visit?  No     Plan:     I have personally reviewed and noted the following in the patient's chart:   Medical and social history Use of alcohol, tobacco or illicit drugs  Current medications and supplements including opioid prescriptions. Patient is not currently taking opioid prescriptions. Functional ability and status Nutritional status Physical activity Advanced directives List of other physicians Hospitalizations, surgeries, and ER visits in previous 12 months Vitals Screenings to include cognitive, depression, and falls Referrals and appointments  In addition, I have reviewed and discussed with patient certain preventive protocols, quality metrics, and best practice recommendations. A written personalized care plan for preventive services as well as general preventive health recommendations were provided to patient.     Areatha Beecham, LPN   09/09/9627   After Visit Summary: (In Person-Printed) AVS printed and given to the patient  Notes: Nothing significant to report at this time.

## 2023-12-27 ENCOUNTER — Ambulatory Visit: Admitting: Internal Medicine

## 2024-01-07 ENCOUNTER — Ambulatory Visit: Admitting: Internal Medicine

## 2024-01-07 ENCOUNTER — Encounter: Payer: Self-pay | Admitting: Internal Medicine

## 2024-01-07 VITALS — BP 130/80 | HR 84 | Temp 98.2°F | Ht 69.0 in | Wt 262.0 lb

## 2024-01-07 DIAGNOSIS — E66812 Obesity, class 2: Secondary | ICD-10-CM | POA: Diagnosis not present

## 2024-01-07 DIAGNOSIS — R7309 Other abnormal glucose: Secondary | ICD-10-CM

## 2024-01-07 DIAGNOSIS — N182 Chronic kidney disease, stage 2 (mild): Secondary | ICD-10-CM | POA: Diagnosis not present

## 2024-01-07 DIAGNOSIS — R251 Tremor, unspecified: Secondary | ICD-10-CM | POA: Diagnosis not present

## 2024-01-07 DIAGNOSIS — R600 Localized edema: Secondary | ICD-10-CM

## 2024-01-07 DIAGNOSIS — G250 Essential tremor: Secondary | ICD-10-CM

## 2024-01-07 DIAGNOSIS — R2689 Other abnormalities of gait and mobility: Secondary | ICD-10-CM

## 2024-01-07 DIAGNOSIS — Z6838 Body mass index (BMI) 38.0-38.9, adult: Secondary | ICD-10-CM | POA: Diagnosis not present

## 2024-01-07 DIAGNOSIS — M48062 Spinal stenosis, lumbar region with neurogenic claudication: Secondary | ICD-10-CM | POA: Diagnosis not present

## 2024-01-07 DIAGNOSIS — I129 Hypertensive chronic kidney disease with stage 1 through stage 4 chronic kidney disease, or unspecified chronic kidney disease: Secondary | ICD-10-CM

## 2024-01-07 DIAGNOSIS — E559 Vitamin D deficiency, unspecified: Secondary | ICD-10-CM

## 2024-01-07 DIAGNOSIS — R0609 Other forms of dyspnea: Secondary | ICD-10-CM | POA: Diagnosis not present

## 2024-01-07 NOTE — Progress Notes (Unsigned)
 I,Jameka J Llittleton, CMA,acting as a Neurosurgeon for Catheryn LOISE Slocumb, MD.,have documented all relevant documentation on the behalf of Catheryn LOISE Slocumb, MD,as directed by  Catheryn LOISE Slocumb, MD while in the presence of Catheryn LOISE Slocumb, MD.  Subjective:  Patient ID: Christopher Harvey , male    DOB: [birth year] , 78 y.o.   MRN: [id number]  Chief Complaint  Patient presents with   Hypertension    Patient is here for HTN follow up. He reports compliance with meds. He is also followed by [facility]. Denies headache, chest pain, and SOB. Patient reports he has been tingling and sharp pains on the bottom of his feet.     HPI Discussed the use of AI scribe software for clinical note transcription with the patient, who gave verbal consent to proceed.  History of Present Illness Christopher Harvey is a 78 year old male with spinal stenosis who presents with sharp pains in the foot and tremors.  He experiences sharp pains in the bottom of his right foot, primarily in the heel and occasionally in the toes. These pains occur sporadically and are not linked to specific activities. He has not had a follow-up visit for this issue since it was initially presented to the [place].  He has a history of spinal stenosis. An MRI of the spine has been conducted, and an MRI of the brain was performed in 2022. He is currently under the care of a neurologist at the [place], with his last visit in February of this year, where he discussed his tremors and balance issues.  He experiences tremors in his right hand, noticeable when holding objects like a glass of water. He steadies his right hand with his left to manage the tremors. Being right-handed may contribute to the prominence of symptoms in the right hand.  He mentions balance issues, particularly when getting up, and has had swelling in his legs and feet, which his wife noticed. The swelling is intermittent and seems to be influenced by factors such as diet and activity level. He  experiences shortness of breath with exertion.  He has a history of elevated PSA levels and was previously evaluated by Dr. Elisabeth at [facility]; however, he plans to transfer his care to the [facility]. [facility]        Hypertension This is a chronic problem. The current episode started more than 1 year ago. The problem has been gradually improving since onset. The problem is uncontrolled. Pertinent negatives include no blurred vision, chest pain, palpitations or shortness of breath. Past treatments include diuretics. The current treatment provides moderate improvement. Compliance problems include exercise.  Hypertensive end-organ damage includes kidney disease.  Back Pain This is a recurrent problem. The problem is unchanged. The pain is present in the gluteal. The quality of the pain is described as aching. The pain is moderate. Pertinent negatives include no chest pain.     Past Medical History:  Diagnosis Date   Cataract    Removed by [facility] [place] [facility]   Encounter for screening fecal occult blood testing 03/05/2023   Hypertension    PTSD (post-traumatic stress disorder)      Family History  Problem Relation Age of Onset   Hypertension Mother    Hypertension Father    Stroke Father      Current Outpatient Medications:    buPROPion (WELLBUTRIN XL) 150 MG 24 hr tablet, Take by mouth., Disp: , Rfl:  hydrochlorothiazide (HYDRODIURIL) 25 MG tablet, Take by mouth., Disp: , Rfl:    hydrOXYzine (ATARAX) 25 MG tablet, 25 mg., Disp: , Rfl:    loratadine (CLARITIN) 10 MG tablet, Take by mouth., Disp: , Rfl:    benzonatate (TESSALON) 100 MG capsule, Take by mouth. (Patient not taking: Reported on 10/10/2023), Disp: , Rfl:    finasteride (PROSCAR) 5 MG tablet, Take by mouth. (Patient not taking: Reported on 01/07/2024), Disp: , Rfl:    tamsulosin  (FLOMAX ) 0.4 MG CAPS capsule, Take 1 capsule (0.4 mg total) by mouth daily. (Patient not taking: Reported on  01/07/2024), Disp: 90 capsule, Rfl: 2   No Known Allergies   Review of Systems  Constitutional: Negative.   Eyes:  Negative for blurred vision.  Respiratory: Negative.  Negative for shortness of breath.   Cardiovascular: Negative.  Negative for chest pain and palpitations.  Gastrointestinal: Negative.   Musculoskeletal:  Positive for back pain.  Neurological:  Positive for tremors.  Psychiatric/Behavioral:  Positive for dysphoric mood.      Today's Vitals   01/07/24 1607  BP: 130/80  Pulse: 84  Temp: 98.2 F (36.8 C)  Weight: 262 lb (118.8 kg)  Height: 5' 9 (1.753 m)  PainSc: 0-No pain   Body mass index is 38.69 kg/m.  Wt Readings from Last 3 Encounters:  01/07/24 262 lb (118.8 kg)  10/10/23 263 lb (119.3 kg)  08/27/23 260 lb 9.6 oz (118.2 kg)    The 10-year ASCVD risk score (Arnett DK, et al., 2019) is: 23.9%   Values used to calculate the score:     Age: 57 years     Clincally relevant sex: Male     Is Non-Hispanic African American: Yes     Diabetic: No     Tobacco smoker: No     Systolic Blood Pressure: 130 mmHg     Is BP treated: Yes     HDL Cholesterol: 41 mg/dL     Total Cholesterol: 160 mg/dL  Objective:  Physical Exam Vitals and nursing note reviewed.  Constitutional:      Appearance: Normal appearance. He is obese.  Eyes:     Extraocular Movements: Extraocular movements intact.  Cardiovascular:     Rate and Rhythm: Normal rate and regular rhythm.     Heart sounds: Normal heart sounds.  Pulmonary:     Effort: Pulmonary effort is normal.     Breath sounds: Normal breath sounds.  Musculoskeletal:     Cervical back: Normal range of motion.  Skin:    General: Skin is warm.  Neurological:     General: No focal deficit present.     Mental Status: He is alert.  Psychiatric:        Mood and Affect: Mood normal.         Assessment And Plan:  Parenchymal renal hypertension, stage 1 through stage 4 or unspecified chronic kidney disease  Other  abnormal glucose  Chronic renal disease, stage II  Class 2 severe obesity due to excess calories with serious comorbidity and body mass index (BMI) of 38.0 to 38.9 in adult [facility])   Assessment & Plan Spinal stenosis with right foot pain, imbalance, and right hand tremor Sharp right foot pain, imbalance, and right hand tremor possibly linked to spinal stenosis. Differential includes neuropathy. Under neurologist care at [facility], follow-up in February. Consider updating brain MRI if symptoms worsen. - [facility] with [facility] about referral to orthopedic or neurosurgeon. - Consider updating MRI if symptoms worsen.  Lower extremity swelling Occasional leg and foot swelling, possibly related to dietary salt intake. No pitting edema observed. - Monitor dietary salt intake and fluid retention.  Shortness of breath on exertion Shortness of breath on exertion suggests possible cardiac issues. - Order heart test for cardiac evaluation. - Recommend [facility] to schedule heart ultrasound.  Chronic kidney disease, unspecified stage Chronic kidney disease under monitoring. - Order lab work to assess kidney function.  Prediabetes Prediabetes under monitoring. - Order lab work to assess glucose levels.    Return in about 4 months (around 05/08/2024), or if symptoms worsen or fail to improve, for BPC.  Patient was given opportunity to ask questions. Patient verbalized understanding of the plan and was able to repeat key elements of the plan. All questions were answered to their satisfaction.    I, Catheryn LOISE Slocumb, MD, have reviewed all documentation for this visit. The documentation on 01/07/24 for the exam, diagnosis, procedures, and orders are all accurate and complete.   IF YOU HAVE BEEN REFERRED TO A SPECIALIST, IT MAY TAKE 1-2 WEEKS TO SCHEDULE/PROCESS THE REFERRAL. IF YOU HAVE NOT HEARD FROM US /SPECIALIST IN TWO WEEKS, PLEASE GIVE US  A CALL AT [phone] X [phone].

## 2024-01-07 NOTE — Patient Instructions (Addendum)
 Tremor - evaluated by Neuro in Feb, keep follow up.   Sharp/shooting pain in right foot - possibly related to spinal stenosis. Consider Neurosurgery vs. Orthopedic evaluation -repeat MRI  Shortness of breath on exertion - 2d Echo  Hypertension, Adult Hypertension is another name for high blood pressure. High blood pressure forces your heart to work harder to pump blood. This can cause problems over time. There are two numbers in a blood pressure reading. There is a top number (systolic) over a bottom number (diastolic). It is best to have a blood pressure that is below 120/80. What are the causes? The cause of this condition is not known. Some other conditions can lead to high blood pressure. What increases the risk? Some lifestyle factors can make you more likely to develop high blood pressure: Smoking. Not getting enough exercise or physical activity. Being overweight. Having too much fat, sugar, calories, or salt (sodium) in your diet. Drinking too much alcohol. Other risk factors include: Having any of these conditions: Heart disease. Diabetes. High cholesterol. Kidney disease. Obstructive sleep apnea. Having a family history of high blood pressure and high cholesterol. Age. The risk increases with age. Stress. What are the signs or symptoms? High blood pressure may not cause symptoms. Very high blood pressure (hypertensive crisis) may cause: Headache. Fast or uneven heartbeats (palpitations). Shortness of breath. Nosebleed. Vomiting or feeling like you may vomit (nauseous). Changes in how you see. Very bad chest pain. Feeling dizzy. Seizures. How is this treated? This condition is treated by making healthy lifestyle changes, such as: Eating healthy foods. Exercising more. Drinking less alcohol. Your doctor may prescribe medicine if lifestyle changes do not help enough and if: Your top number is above 130. Your bottom number is above 80. Your personal target blood  pressure may vary. Follow these instructions at home: Eating and drinking  If told, follow the DASH eating plan. To follow this plan: Fill one half of your plate at each meal with fruits and vegetables. Fill one fourth of your plate at each meal with whole grains. Whole grains include whole-wheat pasta, brown rice, and whole-grain bread. Eat or drink low-fat dairy products, such as skim milk or low-fat yogurt. Fill one fourth of your plate at each meal with low-fat (lean) proteins. Low-fat proteins include fish, chicken without skin, eggs, beans, and tofu. Avoid fatty meat, cured and processed meat, or chicken with skin. Avoid pre-made or processed food. Limit the amount of salt in your diet to less than 1,500 mg each day. Do not drink alcohol if: Your doctor tells you not to drink. You are pregnant, may be pregnant, or are planning to become pregnant. If you drink alcohol: Limit how much you have to: 0-1 drink a day for women. 0-2 drinks a day for men. Know how much alcohol is in your drink. In the [place] one drink equals one 12 oz bottle of beer (355 mL), one 5 oz glass of wine (148 mL), or one 1 oz glass of hard liquor (44 mL). Lifestyle  Work with your doctor to stay at a healthy weight or to lose weight. Ask your doctor what the best weight is for you. Get at least 30 minutes of exercise that causes your heart to beat faster (aerobic exercise) most days of the week. This may include walking, swimming, or biking. Get at least 30 minutes of exercise that strengthens your muscles (resistance exercise) at least 3 days a week. This may include lifting weights or doing Pilates. Do  not smoke or use any products that contain nicotine or tobacco. If you need help quitting, ask your doctor. Check your blood pressure at home as told by your doctor. Keep all follow-up visits. Medicines Take over-the-counter and prescription medicines only as told by your doctor. Follow directions carefully. Do  not skip doses of blood pressure medicine. The medicine does not work as well if you skip doses. Skipping doses also puts you at risk for problems. Ask your doctor about side effects or reactions to medicines that you should watch for. Contact a doctor if: You think you are having a reaction to the medicine you are taking. You have headaches that keep coming back. You feel dizzy. You have swelling in your ankles. You have trouble with your vision. Get help right away if: You get a very bad headache. You start to feel mixed up (confused). You feel weak or numb. You feel faint. You have very bad pain in your: Chest. Belly (abdomen). You vomit more than once. You have trouble breathing. These symptoms may be an emergency. Get help right away. Call 911. Do not wait to see if the symptoms will go away. Do not drive yourself to the hospital. Summary Hypertension is another name for high blood pressure. High blood pressure forces your heart to work harder to pump blood. For most people, a normal blood pressure is less than 120/80. Making healthy choices can help lower blood pressure. If your blood pressure does not get lower with healthy choices, you may need to take medicine. This information is not intended to replace advice given to you by your health care provider. Make sure you discuss any questions you have with your health care provider. Document Revised: 03/10/2021 Document Reviewed: 03/10/2021 Elsevier Patient Education  2024 [employer].

## 2024-01-08 LAB — BMP8+EGFR
BUN/Creatinine Ratio: 13 (ref 10–24)
BUN: 14 mg/dL (ref 8–27)
CO2: 27 mmol/L (ref 20–29)
Calcium: 9.7 mg/dL (ref 8.6–10.2)
Chloride: 101 mmol/L (ref 96–106)
Creatinine, Ser: 1.1 mg/dL (ref 0.76–1.27)
Glucose: 97 mg/dL (ref 70–99)
Potassium: 4.2 mmol/L (ref 3.5–5.2)
Sodium: 140 mmol/L (ref 134–144)
eGFR: 69 mL/min/1.73 (ref >59)

## 2024-01-08 LAB — CBC
Hematocrit: 44.7 % (ref 37.5–51.0)
Hemoglobin: 14.7 g/dL (ref 13.0–17.7)
MCH: 31.1 pg (ref 26.6–33.0)
MCHC: 32.9 g/dL (ref 31.5–35.7)
MCV: 95 fL (ref 79–97)
Platelets: 199 x10E3/uL (ref 150–450)
RBC: 4.73 x10E6/uL (ref 4.14–5.80)
RDW: 12 % (ref 11.6–15.4)
WBC: 6.1 x10E3/uL (ref 3.4–10.8)

## 2024-01-08 LAB — VITAMIN B12: Vitamin B-12: 705 pg/mL (ref 232–1245)

## 2024-01-08 LAB — BRAIN NATRIURETIC PEPTIDE: BNP: 5.9 pg/mL (ref 0.0–100.0)

## 2024-01-08 LAB — HEMOGLOBIN A1C
Est. average glucose Bld gHb Est-mCnc: 134 mg/dL
Hgb A1c MFr Bld: 6.3 % — ABNORMAL HIGH (ref 4.8–5.6)

## 2024-01-08 LAB — VITAMIN D 25 HYDROXY (VIT D DEFICIENCY, FRACTURES): Vit D, 25-Hydroxy: 37.8 ng/mL (ref 30.0–100.0)

## 2024-01-09 ENCOUNTER — Ambulatory Visit: Payer: Self-pay | Admitting: Internal Medicine

## 2024-01-09 DIAGNOSIS — R0609 Other forms of dyspnea: Secondary | ICD-10-CM | POA: Insufficient documentation

## 2024-01-09 DIAGNOSIS — G250 Essential tremor: Secondary | ICD-10-CM | POA: Insufficient documentation

## 2024-01-09 DIAGNOSIS — R600 Localized edema: Secondary | ICD-10-CM | POA: Insufficient documentation

## 2024-01-09 NOTE — Assessment & Plan Note (Signed)
 Chronic, he is encouraged to keep BP well controlled and to stay well hydrated to decrease risk of CKD progression.

## 2024-01-09 NOTE — Assessment & Plan Note (Signed)
 Chronic, fair control. Goal BP<120/80.  He is also managed at the [facility]. He will continue with hydrochlorothiazide 25mg  daily. He is encouraged to follow a low sodium diet.

## 2024-01-09 NOTE — Assessment & Plan Note (Signed)
 Occasional leg and foot swelling, possibly related to dietary salt intake. No pitting edema observed. - Monitor dietary salt intake and fluid retention.

## 2024-01-09 NOTE — Assessment & Plan Note (Addendum)
 Shortness of breath on exertion suggests possible cardiac issues. - Order heart test for cardiac evaluation. - [facility] to [facility], per [facility] preference - [facility]

## 2024-01-09 NOTE — Assessment & Plan Note (Signed)
 Intermittent symptoms. I will check labs as below.

## 2024-01-09 NOTE — Assessment & Plan Note (Signed)
 He is encouraged to initially strive for BMI less than 30 to decrease cardiac risk. He is advised to exercise no less than 150 minutes per week.

## 2024-01-09 NOTE — Assessment & Plan Note (Signed)
 Previous labs reviewed, his A1c has been elevated in the past. I will check an A1c today. Reminded to avoid refined sugars including sugary drinks/foods and processed meats including bacon, sausages and deli meats.

## 2024-01-09 NOTE — Assessment & Plan Note (Signed)
 Not apparent today at rest. Will check thyroid  function. Consider MRI brain after reviewing labs.  - He plans to speak to [facility] PCP about Neurology evaluation.

## 2024-01-09 NOTE — Assessment & Plan Note (Signed)
 Sharp right foot pain and imbalance possibly linked to spinal stenosis. Differential includes neuropathy. Under neurologist care at [facility], follow-up in February. Consider updating lumbosacral MRI if symptoms worsen. - [facility] with [facility] about referral to orthopedic or neurosurgeon. - Consider updating MRI if symptoms worsen.

## 2024-01-11 LAB — SPECIMEN STATUS REPORT

## 2024-01-11 LAB — TSH: TSH: 1.89 u[IU]/mL (ref 0.450–4.500)

## 2024-03-11 DIAGNOSIS — Z6836 Body mass index (BMI) 36.0-36.9, adult: Secondary | ICD-10-CM | POA: Diagnosis not present

## 2024-03-11 DIAGNOSIS — F3341 Major depressive disorder, recurrent, in partial remission: Secondary | ICD-10-CM | POA: Diagnosis not present

## 2024-03-11 DIAGNOSIS — I10 Essential (primary) hypertension: Secondary | ICD-10-CM | POA: Diagnosis not present

## 2024-03-11 DIAGNOSIS — R7303 Prediabetes: Secondary | ICD-10-CM | POA: Diagnosis not present

## 2024-03-11 DIAGNOSIS — Z008 Encounter for other general examination: Secondary | ICD-10-CM | POA: Diagnosis not present

## 2024-03-11 DIAGNOSIS — N401 Enlarged prostate with lower urinary tract symptoms: Secondary | ICD-10-CM | POA: Diagnosis not present

## 2024-09-01 ENCOUNTER — Encounter: Payer: Self-pay | Admitting: Internal Medicine

## 2024-11-19 ENCOUNTER — Ambulatory Visit: Payer: Self-pay
